# Patient Record
Sex: Female | Born: 1955 | Race: White | Hispanic: No | Marital: Married | Smoking: Never smoker
Health system: Southern US, Community
[De-identification: ages and names within clinical notes are randomized; demographics above are authoritative.]

## PROBLEM LIST (undated history)

## (undated) DIAGNOSIS — E0500 Thyrotoxicosis with diffuse goiter without thyrotoxic crisis or storm: Secondary | ICD-10-CM

## (undated) DIAGNOSIS — I10 Essential (primary) hypertension: Secondary | ICD-10-CM

## (undated) DIAGNOSIS — E785 Hyperlipidemia, unspecified: Secondary | ICD-10-CM

## (undated) HISTORY — DX: Essential (primary) hypertension: I10

## (undated) HISTORY — DX: Hyperlipidemia, unspecified: E78.5

## (undated) HISTORY — DX: Thyrotoxicosis with diffuse goiter without thyrotoxic crisis or storm: E05.00

---

## 2000-11-13 ENCOUNTER — Other Ambulatory Visit: Admission: RE | Admit: 2000-11-13 | Discharge: 2000-11-13 | Payer: Self-pay | Admitting: Obstetrics and Gynecology

## 2002-06-03 ENCOUNTER — Other Ambulatory Visit: Admission: RE | Admit: 2002-06-03 | Discharge: 2002-06-03 | Payer: Self-pay | Admitting: Obstetrics and Gynecology

## 2004-12-13 ENCOUNTER — Other Ambulatory Visit: Admission: RE | Admit: 2004-12-13 | Discharge: 2004-12-13 | Payer: Self-pay | Admitting: Obstetrics and Gynecology

## 2007-09-18 ENCOUNTER — Encounter: Payer: Self-pay | Admitting: Endocrinology

## 2007-10-27 ENCOUNTER — Ambulatory Visit: Payer: Self-pay | Admitting: Endocrinology

## 2007-10-27 DIAGNOSIS — E0500 Thyrotoxicosis with diffuse goiter without thyrotoxic crisis or storm: Secondary | ICD-10-CM

## 2007-10-27 HISTORY — DX: Thyrotoxicosis with diffuse goiter without thyrotoxic crisis or storm: E05.00

## 2007-11-12 ENCOUNTER — Telehealth: Payer: Self-pay | Admitting: Endocrinology

## 2007-11-21 ENCOUNTER — Encounter: Admission: RE | Admit: 2007-11-21 | Discharge: 2007-11-21 | Payer: Self-pay | Admitting: Endocrinology

## 2007-12-22 ENCOUNTER — Ambulatory Visit: Payer: Self-pay | Admitting: Endocrinology

## 2007-12-22 LAB — CONVERTED CEMR LAB: TSH: 0.04 microintl units/mL — ABNORMAL LOW (ref 0.35–5.50)

## 2008-01-21 ENCOUNTER — Ambulatory Visit: Payer: Self-pay | Admitting: Endocrinology

## 2008-01-21 DIAGNOSIS — E890 Postprocedural hypothyroidism: Secondary | ICD-10-CM | POA: Insufficient documentation

## 2008-01-21 LAB — CONVERTED CEMR LAB: Free T4: 1 ng/dL (ref 0.6–1.6)

## 2008-01-30 ENCOUNTER — Telehealth: Payer: Self-pay | Admitting: Endocrinology

## 2008-02-06 ENCOUNTER — Ambulatory Visit: Payer: Self-pay | Admitting: Endocrinology

## 2008-03-10 ENCOUNTER — Ambulatory Visit: Payer: Self-pay | Admitting: Endocrinology

## 2008-03-10 LAB — CONVERTED CEMR LAB
Free T4: 0.5 ng/dL — ABNORMAL LOW (ref 0.6–1.6)
TSH: 21.96 microintl units/mL — ABNORMAL HIGH (ref 0.35–5.50)

## 2008-04-22 ENCOUNTER — Ambulatory Visit: Payer: Self-pay | Admitting: Endocrinology

## 2008-04-22 LAB — CONVERTED CEMR LAB: TSH: 5.19 microintl units/mL (ref 0.35–5.50)

## 2008-05-24 ENCOUNTER — Telehealth (INDEPENDENT_AMBULATORY_CARE_PROVIDER_SITE_OTHER): Payer: Self-pay | Admitting: *Deleted

## 2008-05-25 ENCOUNTER — Ambulatory Visit: Payer: Self-pay | Admitting: Endocrinology

## 2008-05-25 DIAGNOSIS — R040 Epistaxis: Secondary | ICD-10-CM | POA: Insufficient documentation

## 2008-05-25 LAB — CONVERTED CEMR LAB
Basophils Absolute: 0 10*3/uL (ref 0.0–0.1)
Hemoglobin: 16.4 g/dL — ABNORMAL HIGH (ref 12.0–15.0)
Lymphocytes Relative: 22.5 % (ref 12.0–46.0)
MCHC: 35.5 g/dL (ref 30.0–36.0)
Monocytes Relative: 5.6 % (ref 3.0–12.0)
Neutro Abs: 3.8 10*3/uL (ref 1.4–7.7)
Neutrophils Relative %: 67.7 % (ref 43.0–77.0)
RDW: 12.2 % (ref 11.5–14.6)
TSH: 2.9 microintl units/mL (ref 0.35–5.50)

## 2008-08-25 ENCOUNTER — Ambulatory Visit: Payer: Self-pay | Admitting: Endocrinology

## 2008-08-25 LAB — CONVERTED CEMR LAB: TSH: 0.53 microintl units/mL (ref 0.35–5.50)

## 2015-11-15 ENCOUNTER — Other Ambulatory Visit: Payer: Self-pay | Admitting: Obstetrics and Gynecology

## 2015-11-15 DIAGNOSIS — R928 Other abnormal and inconclusive findings on diagnostic imaging of breast: Secondary | ICD-10-CM

## 2015-11-28 ENCOUNTER — Ambulatory Visit
Admission: RE | Admit: 2015-11-28 | Discharge: 2015-11-28 | Disposition: A | Discharge status: Home / Self Care | Payer: BLUE CROSS/BLUE SHIELD | Source: Ambulatory Visit | Attending: Obstetrics and Gynecology | Admitting: Obstetrics and Gynecology

## 2015-11-28 DIAGNOSIS — R928 Other abnormal and inconclusive findings on diagnostic imaging of breast: Secondary | ICD-10-CM

## 2016-05-16 ENCOUNTER — Other Ambulatory Visit: Payer: Self-pay | Admitting: Obstetrics and Gynecology

## 2016-05-16 DIAGNOSIS — N63 Unspecified lump in unspecified breast: Secondary | ICD-10-CM

## 2016-06-22 ENCOUNTER — Ambulatory Visit
Admission: RE | Admit: 2016-06-22 | Discharge: 2016-06-22 | Disposition: A | Discharge status: Home / Self Care | Payer: BLUE CROSS/BLUE SHIELD | Source: Ambulatory Visit | Attending: Obstetrics and Gynecology | Admitting: Obstetrics and Gynecology

## 2016-06-22 DIAGNOSIS — N63 Unspecified lump in unspecified breast: Secondary | ICD-10-CM

## 2016-11-27 ENCOUNTER — Other Ambulatory Visit: Payer: Self-pay | Admitting: Obstetrics and Gynecology

## 2016-11-27 DIAGNOSIS — N6320 Unspecified lump in the left breast, unspecified quadrant: Secondary | ICD-10-CM

## 2016-12-05 ENCOUNTER — Ambulatory Visit
Admission: RE | Admit: 2016-12-05 | Discharge: 2016-12-05 | Disposition: A | Discharge status: Home / Self Care | Payer: BLUE CROSS/BLUE SHIELD | Source: Ambulatory Visit | Attending: Obstetrics and Gynecology | Admitting: Obstetrics and Gynecology

## 2016-12-05 DIAGNOSIS — N6320 Unspecified lump in the left breast, unspecified quadrant: Secondary | ICD-10-CM

## 2017-12-09 ENCOUNTER — Other Ambulatory Visit: Payer: Self-pay | Admitting: Obstetrics and Gynecology

## 2017-12-09 DIAGNOSIS — Z1231 Encounter for screening mammogram for malignant neoplasm of breast: Secondary | ICD-10-CM

## 2017-12-13 ENCOUNTER — Ambulatory Visit
Admission: RE | Admit: 2017-12-13 | Discharge: 2017-12-13 | Disposition: A | Discharge status: Home / Self Care | Payer: BLUE CROSS/BLUE SHIELD | Source: Ambulatory Visit | Attending: Obstetrics and Gynecology | Admitting: Obstetrics and Gynecology

## 2017-12-13 ENCOUNTER — Encounter: Payer: Self-pay | Admitting: Radiology

## 2017-12-13 DIAGNOSIS — Z1231 Encounter for screening mammogram for malignant neoplasm of breast: Secondary | ICD-10-CM

## 2018-10-27 LAB — COLOGUARD: Cologuard: NEGATIVE

## 2019-06-01 DIAGNOSIS — R7989 Other specified abnormal findings of blood chemistry: Secondary | ICD-10-CM | POA: Diagnosis not present

## 2019-06-19 DIAGNOSIS — R7989 Other specified abnormal findings of blood chemistry: Secondary | ICD-10-CM | POA: Diagnosis not present

## 2019-12-08 ENCOUNTER — Telehealth: Payer: Self-pay

## 2019-12-08 NOTE — Telephone Encounter (Signed)
Bridget Jones called to let Dr. Sedalia Muta know that she has decided that she will try the statin medication.  She does not have medication coverage but she is willing to try a statin.  She request that the prescription be sent to  [facility].

## 2019-12-09 ENCOUNTER — Other Ambulatory Visit: Payer: Self-pay | Admitting: Family Medicine

## 2019-12-28 ENCOUNTER — Other Ambulatory Visit: Payer: Self-pay

## 2019-12-28 MED ORDER — LEVOTHYROXINE SODIUM 125 MCG PO TABS: 125.0000 ug | ORAL_TABLET | Freq: Every day | ORAL | 2 refills | Status: DC

## 2019-12-29 ENCOUNTER — Other Ambulatory Visit: Payer: Self-pay

## 2019-12-29 ENCOUNTER — Other Ambulatory Visit: Payer: Self-pay | Admitting: Family Medicine

## 2019-12-29 MED ORDER — ATORVASTATIN CALCIUM 10 MG PO TABS: 10.0000 mg | ORAL_TABLET | Freq: Every day | ORAL | 0 refills | Status: DC

## 2019-12-29 NOTE — Progress Notes (Signed)
Sent atorvastatin. Kc

## 2020-01-25 ENCOUNTER — Other Ambulatory Visit: Payer: Self-pay

## 2020-01-25 MED ORDER — LEVOTHYROXINE SODIUM 125 MCG PO TABS: 125.0000 ug | ORAL_TABLET | Freq: Every day | ORAL | 0 refills | Status: DC

## 2020-03-23 ENCOUNTER — Other Ambulatory Visit: Payer: Self-pay | Admitting: Family Medicine

## 2020-04-06 ENCOUNTER — Ambulatory Visit: Payer: PRIVATE HEALTH INSURANCE | Admitting: Family Medicine

## 2020-04-19 ENCOUNTER — Other Ambulatory Visit: Payer: Self-pay | Admitting: Family Medicine

## 2020-06-08 NOTE — Progress Notes (Signed)
Subjective:  Patient ID: Bridget Jones, female    DOB: 08/09/1956  Age: 64 y.o. MRN: [id number]  Chief Complaint  Patient presents with  . Hypothyroidism  . Hypertension    HPI  Patient presents with atrophy of thyroid (acquired).  Date of diagnosis 2010.  She is currently taking Synthroid, 125 mcg daily.  TSH was last checked 10 months ago.  The result was reported as normal.  She denies any related symptoms.      Pt presents for follow up of hypertension.  Date of diagnosis 2010.  Current nonpharmacologic treatment includes low sodium diet and exercise.    Patient presents for hyperlipidemia. Tolerating lipitor. Eating healthy. Exercising.   Current Outpatient Medications on File Prior to Visit  Medication Sig Dispense Refill  . atorvastatin (LIPITOR) 10 MG tablet Take 1 tablet by mouth once daily 90 tablet 0  . EUTHYROX 125 MCG tablet Take 1 tablet by mouth once daily 90 tablet 0   No current facility-administered medications on file prior to visit.   No past medical history on file. History reviewed. No pertinent surgical history.  Family History  Problem Relation Age of Onset  . Hypertension Mother   . Diabetes Father   . Stroke Father   . Hyperthyroidism Sister    Social History   Socioeconomic History  . Marital status: Married    Spouse name: Not on file  . Number of children: Not on file  . Years of education: Not on file  . Highest education level: Not on file  Occupational History  . Not on file  Tobacco Use  . Smoking status: Never Smoker  . Smokeless tobacco: Never Used  Substance and Sexual Activity  . Alcohol use: Yes    Comment: rarely ( one drink per month)  . Drug use: Never  . Sexual activity: Yes  Other Topics Concern  . Not on file  Social History Narrative  . Not on file   Social Determinants of Health   Financial Resource Strain:   . Difficulty of Paying Living Expenses: Not on file  Food Insecurity:   . Worried About Brewing technologist in the Last Year: Not on file  . Ran Out of Food in the Last Year: Not on file  Transportation Needs:   . Lack of Transportation (Medical): Not on file  . Lack of Transportation (Non-Medical): Not on file  Physical Activity:   . Days of Exercise per Week: Not on file  . Minutes of Exercise per Session: Not on file  Stress:   . Feeling of Stress : Not on file  Social Connections:   . Frequency of Communication with Friends and Family: Not on file  . Frequency of Social Gatherings with Friends and Family: Not on file  . Attends Religious Services: Not on file  . Active Member of Clubs or Organizations: Not on file  . Attends Banker Meetings: Not on file  . Marital Status: Not on file    Review of Systems  Constitutional: Negative for chills, fatigue and fever.  HENT: Negative for congestion, ear pain, rhinorrhea and sore throat.   Respiratory: Negative for cough and shortness of breath.   Cardiovascular: Positive for leg swelling. Negative for chest pain and palpitations.  Gastrointestinal: Negative for abdominal pain, constipation, diarrhea, nausea and vomiting.  Genitourinary: Negative for dysuria and urgency.  Musculoskeletal: Positive for arthralgias (right knee pain ). Negative for back pain and myalgias.  Neurological: Negative for  dizziness, weakness, light-headedness and headaches.  Psychiatric/Behavioral: Negative for dysphoric mood. The patient is not nervous/anxious.      Objective:  BP 114/68   Pulse 72   Temp 97.6 F (36.4 C)   Resp 16   Ht 5\' 5"  (1.651 m)   Wt 210 lb 9.6 oz (95.5 kg)   BMI 35.05 kg/m   BP/Weight 06/09/2020 08/25/2008 05/25/2008  Systolic BP 114 124 112  Diastolic BP 68 80 82  Wt. (Lbs) 210.6 193 183  BMI 35.05 - -    Physical Exam Vitals reviewed.  Constitutional:      Appearance: Normal appearance.  Cardiovascular:     Rate and Rhythm: Normal rate and regular rhythm.  Pulmonary:     Effort: Pulmonary effort is  normal.     Breath sounds: Normal breath sounds.  Abdominal:     General: Bowel sounds are normal.  Musculoskeletal:        General: Normal range of motion.     Cervical back: Normal range of motion.  Skin:    General: Skin is warm.  Neurological:     Mental Status: She is alert.  Psychiatric:        Mood and Affect: Mood normal.        Behavior: Behavior normal.     Diabetic Foot Exam - Simple   No data filed       Lab Results  Component Value Date   WBC 5.1 06/09/2020   HGB 15.4 06/09/2020   HCT 47.9 (H) 06/09/2020   PLT 222 06/09/2020   GLUCOSE 91 06/09/2020   CHOL 146 06/09/2020   TRIG 79 06/09/2020   HDL 40 06/09/2020   LDLCALC 91 06/09/2020   ALT 35 (H) 06/09/2020   AST 28 06/09/2020   NA 142 06/09/2020   K 4.5 06/09/2020   CL 106 06/09/2020   CREATININE 0.83 06/09/2020   BUN 16 06/09/2020   CO2 22 06/09/2020   TSH 1.420 06/09/2020      Assessment & Plan:   1. Essential hypertension Well controlled.  No changes to medicines.  Continue to work on eating a healthy diet and exercise.  Labs drawn today.  - CBC with Differential/Platelet - Comprehensive metabolic panel - Lipid panel  2. Hypothyroidism, unspecified type Well controlled.  No changes to medicines.  Continue to work on eating a healthy diet and exercise.  Labs drawn today.  - TSH   3. Elevated hemoglobin (HCC) Check ferritin - Ferritin  4. Class 2 severe obesity due to excess calories with serious comorbidity and body mass index (BMI) of 35.0 to 35.9 in adult [facility]) Recommend continue to work on eating healthy diet and exercise.   Orders Placed This Encounter  Procedures  . CBC with Differential/Platelet  . Comprehensive metabolic panel  . Lipid panel  . TSH  . Ferritin  . Cardiovascular Risk Assessment     Follow-up: No follow-ups on file.  An After Visit Summary was printed and given to the patient.  [facility] [phone]

## 2020-06-09 ENCOUNTER — Encounter: Payer: Self-pay | Admitting: Family Medicine

## 2020-06-09 ENCOUNTER — Other Ambulatory Visit: Payer: Self-pay

## 2020-06-09 ENCOUNTER — Ambulatory Visit (INDEPENDENT_AMBULATORY_CARE_PROVIDER_SITE_OTHER): Payer: PRIVATE HEALTH INSURANCE | Admitting: Family Medicine

## 2020-06-09 VITALS — BP 114/68 | HR 72 | Temp 97.6°F | Resp 16 | Ht 65.0 in | Wt 210.6 lb

## 2020-06-09 DIAGNOSIS — D582 Other hemoglobinopathies: Secondary | ICD-10-CM

## 2020-06-09 DIAGNOSIS — E890 Postprocedural hypothyroidism: Secondary | ICD-10-CM | POA: Diagnosis not present

## 2020-06-09 DIAGNOSIS — I10 Essential (primary) hypertension: Secondary | ICD-10-CM | POA: Diagnosis not present

## 2020-06-09 DIAGNOSIS — Z6835 Body mass index (BMI) 35.0-35.9, adult: Secondary | ICD-10-CM

## 2020-06-09 NOTE — Patient Instructions (Signed)
Compression stockings.  

## 2020-06-10 LAB — CBC WITH DIFFERENTIAL/PLATELET
Basophils Absolute: 0.1 10*3/uL (ref 0.0–0.2)
Basos: 1 %
EOS (ABSOLUTE): 0.2 10*3/uL (ref 0.0–0.4)
Eos: 4 %
Hematocrit: 47.9 % — ABNORMAL HIGH (ref 34.0–46.6)
Hemoglobin: 15.4 g/dL (ref 11.1–15.9)
Immature Grans (Abs): 0 10*3/uL (ref 0.0–0.1)
Immature Granulocytes: 0 %
Lymphocytes Absolute: 2.1 10*3/uL (ref 0.7–3.1)
Lymphs: 40 %
MCH: 29 pg (ref 26.6–33.0)
MCHC: 32.2 g/dL (ref 31.5–35.7)
MCV: 90 fL (ref 79–97)
Monocytes Absolute: 0.5 10*3/uL (ref 0.1–0.9)
Monocytes: 10 %
Neutrophils Absolute: 2.3 10*3/uL (ref 1.4–7.0)
Neutrophils: 45 %
Platelets: 222 10*3/uL (ref 150–450)
RBC: 5.31 x10E6/uL — ABNORMAL HIGH (ref 3.77–5.28)
RDW: 13.6 % (ref 11.7–15.4)
WBC: 5.1 10*3/uL (ref 3.4–10.8)

## 2020-06-10 LAB — COMPREHENSIVE METABOLIC PANEL
ALT: 35 IU/L — ABNORMAL HIGH (ref 0–32)
AST: 28 IU/L (ref 0–40)
Albumin/Globulin Ratio: 1.6 (ref 1.2–2.2)
Albumin: 4.2 g/dL (ref 3.8–4.8)
Alkaline Phosphatase: 62 IU/L (ref 48–121)
BUN/Creatinine Ratio: 19 (ref 12–28)
BUN: 16 mg/dL (ref 8–27)
Bilirubin Total: 1 mg/dL (ref 0.0–1.2)
CO2: 22 mmol/L (ref 20–29)
Calcium: 9.5 mg/dL (ref 8.7–10.3)
Chloride: 106 mmol/L (ref 96–106)
Creatinine, Ser: 0.83 mg/dL (ref 0.57–1.00)
GFR calc Af Amer: 87 mL/min/{1.73_m2} (ref >59)
GFR calc non Af Amer: 75 mL/min/{1.73_m2} (ref >59)
Globulin, Total: 2.6 g/dL (ref 1.5–4.5)
Glucose: 91 mg/dL (ref 65–99)
Potassium: 4.5 mmol/L (ref 3.5–5.2)
Sodium: 142 mmol/L (ref 134–144)
Total Protein: 6.8 g/dL (ref 6.0–8.5)

## 2020-06-10 LAB — LIPID PANEL
Chol/HDL Ratio: 3.7 ratio (ref 0.0–4.4)
Cholesterol, Total: 146 mg/dL (ref 100–199)
HDL: 40 mg/dL (ref >39)
LDL Chol Calc (NIH): 91 mg/dL (ref 0–99)
Triglycerides: 79 mg/dL (ref 0–149)
VLDL Cholesterol Cal: 15 mg/dL (ref 5–40)

## 2020-06-10 LAB — FERRITIN: Ferritin: 401 ng/mL — ABNORMAL HIGH (ref 15–150)

## 2020-06-10 LAB — TSH: TSH: 1.42 u[IU]/mL (ref 0.450–4.500)

## 2020-06-10 LAB — CARDIOVASCULAR RISK ASSESSMENT

## 2020-06-21 ENCOUNTER — Encounter: Payer: Self-pay | Admitting: Family Medicine

## 2020-06-27 ENCOUNTER — Other Ambulatory Visit: Payer: Self-pay | Admitting: Family Medicine

## 2020-08-26 ENCOUNTER — Other Ambulatory Visit: Payer: Self-pay | Admitting: Obstetrics and Gynecology

## 2020-08-26 DIAGNOSIS — Z1231 Encounter for screening mammogram for malignant neoplasm of breast: Secondary | ICD-10-CM

## 2020-09-26 ENCOUNTER — Other Ambulatory Visit: Payer: Self-pay

## 2020-09-26 MED ORDER — ATORVASTATIN CALCIUM 10 MG PO TABS: 10.0000 mg | ORAL_TABLET | Freq: Every day | ORAL | 0 refills | Status: DC

## 2020-10-06 ENCOUNTER — Other Ambulatory Visit: Payer: Self-pay | Admitting: Obstetrics and Gynecology

## 2020-10-06 ENCOUNTER — Other Ambulatory Visit: Payer: Self-pay

## 2020-10-06 ENCOUNTER — Ambulatory Visit
Admission: RE | Admit: 2020-10-06 | Discharge: 2020-10-06 | Disposition: A | Discharge status: Home / Self Care | Payer: PRIVATE HEALTH INSURANCE | Source: Ambulatory Visit | Attending: Obstetrics and Gynecology | Admitting: Obstetrics and Gynecology

## 2020-10-06 DIAGNOSIS — Z1231 Encounter for screening mammogram for malignant neoplasm of breast: Secondary | ICD-10-CM

## 2020-11-10 NOTE — Progress Notes (Signed)
Subjective:  Patient ID: Bridget Jones, female    DOB: May 22, 1956  Age: 65 y.o. MRN: [id number]  Chief Complaint  Patient presents with  . Hypothyroidism  . Hypertension    HPI Hyperlipidemia: On lipitor 10 mg once daily. Eating healthy and exercise. Hypothyroidism - on Syntrhoid 125 mcg once daily  Current Outpatient Medications on File Prior to Visit  Medication Sig Dispense Refill  . atorvastatin (LIPITOR) 10 MG tablet Take 1 tablet (10 mg total) by mouth daily. 90 tablet 0  . calcium carbonate (OSCAL) 1500 (600 Ca) MG TABS tablet Take by mouth 2 (two) times daily with a meal.    . levothyroxine (SYNTHROID) 125 MCG tablet Take 1 tablet by mouth once daily 90 tablet 0  . Multiple Vitamin (MULTIVITAMIN) tablet Take 1 tablet by mouth daily.    . Omega-3 Fatty Acids (FISH OIL) 1000 MG CPDR Take by mouth.    . Turmeric (QC TUMERIC COMPLEX PO) Take by mouth.     No current facility-administered medications on file prior to visit.   Past Medical History:  Diagnosis Date  . Hyperlipidemia   . Hypertension    History reviewed. No pertinent surgical history.  Family History  Problem Relation Age of Onset  . Hypertension Mother   . Diabetes Father   . Stroke Father   . Hyperthyroidism Sister    Social History   Socioeconomic History  . Marital status: Married    Spouse name: Not on file  . Number of children: 2  . Years of education: Not on file  . Highest education level: Not on file  Occupational History  . Not on file  Tobacco Use  . Smoking status: Never Smoker  . Smokeless tobacco: Never Used  Substance and Sexual Activity  . Alcohol use: Yes    Comment: rarely ( one drink per month)  . Drug use: Never  . Sexual activity: Yes  Other Topics Concern  . Not on file  Social History Narrative  . Not on file   Social Determinants of Health   Financial Resource Strain: Not on file  Food Insecurity: Not on file  Transportation Needs: Not on file  Physical  Activity: Not on file  Stress: Not on file  Social Connections: Not on file    Review of Systems  Constitutional: Negative for chills, fatigue and fever.  HENT: Negative for congestion, ear pain, rhinorrhea and sore throat.   Respiratory: Negative for cough and shortness of breath.   Cardiovascular: Negative for chest pain.  Gastrointestinal: Negative for abdominal pain, constipation, diarrhea, nausea and vomiting.  Genitourinary: Negative for dysuria and urgency.  Musculoskeletal: Positive for arthralgias (Knees. Takes tumeric and occasionally tylenoll). Negative for back pain and myalgias.  Neurological: Negative for dizziness, weakness, light-headedness and headaches.  Psychiatric/Behavioral: Negative for dysphoric mood. The patient is not nervous/anxious.     Objective:  BP 130/80   Pulse 68   Temp 97.7 F (36.5 C)   Resp 16   Ht 5\' 5"  (1.651 m)   Wt 215 lb 3.2 oz (97.6 kg)   BMI 35.81 kg/m   BP/Weight 11/11/2020 06/09/2020 08/25/2008  Systolic BP 130 114 124  Diastolic BP 80 68 80  Wt. (Lbs) 215.2 210.6 193  BMI 35.81 35.05 -    Physical Exam Vitals reviewed.  Constitutional:      Appearance: Normal appearance.  Cardiovascular:     Rate and Rhythm: Normal rate and regular rhythm.  Pulmonary:     Effort:  Pulmonary effort is normal.     Breath sounds: Normal breath sounds.  Abdominal:     General: Bowel sounds are normal.  Musculoskeletal:        General: Normal range of motion.     Cervical back: Normal range of motion.  Skin:    General: Skin is warm.  Neurological:     Mental Status: She is alert.  Psychiatric:        Mood and Affect: Mood normal.        Behavior: Behavior normal.     Diabetic Foot Exam - Simple   No data filed      Lab Results  Component Value Date   WBC 5.1 06/09/2020   HGB 15.4 06/09/2020   HCT 47.9 (H) 06/09/2020   PLT 222 06/09/2020   GLUCOSE 91 06/09/2020   CHOL 146 06/09/2020   TRIG 79 06/09/2020   HDL 40 06/09/2020    LDLCALC 91 06/09/2020   ALT 35 (H) 06/09/2020   AST 28 06/09/2020   NA 142 06/09/2020   K 4.5 06/09/2020   CL 106 06/09/2020   CREATININE 0.83 06/09/2020   BUN 16 06/09/2020   CO2 22 06/09/2020   TSH 1.420 06/09/2020      Assessment & Plan:   1. Postablative hypothyroidism - TSH  2. Mixed hyperlipidemia Recommend continue to work on eating healthy diet and exercise. The current medical regimen is effective;  continue present plan and medications. - CBC with Differential/Platelet - Comprehensive metabolic panel - Lipid panel  3. Class 2 drug-induced obesity with serious comorbidity and body mass index (BMI) of 35.0 to 35.9 in adult  Recommend continue to work on eating healthy diet and exercise.  Recommend covid 19 vaccination booster. Pt to think about it.   Orders Placed This Encounter  Procedures  . CBC with Differential/Platelet  . Comprehensive metabolic panel  . Lipid panel  . TSH     Follow-up: Return in about 6 months (around 05/11/2021) for fasting.  An After Visit Summary was printed and given to the patient.  Blane Ohara, MD [facility] [phone]

## 2020-11-11 ENCOUNTER — Ambulatory Visit (INDEPENDENT_AMBULATORY_CARE_PROVIDER_SITE_OTHER): Payer: PRIVATE HEALTH INSURANCE | Admitting: Family Medicine

## 2020-11-11 ENCOUNTER — Encounter: Payer: Self-pay | Admitting: Family Medicine

## 2020-11-11 ENCOUNTER — Other Ambulatory Visit: Payer: Self-pay

## 2020-11-11 VITALS — BP 130/80 | HR 68 | Temp 97.7°F | Resp 16 | Ht 65.0 in | Wt 215.2 lb

## 2020-11-11 DIAGNOSIS — Z6835 Body mass index (BMI) 35.0-35.9, adult: Secondary | ICD-10-CM | POA: Diagnosis not present

## 2020-11-11 DIAGNOSIS — E890 Postprocedural hypothyroidism: Secondary | ICD-10-CM

## 2020-11-11 DIAGNOSIS — E661 Drug-induced obesity: Secondary | ICD-10-CM | POA: Diagnosis not present

## 2020-11-11 DIAGNOSIS — I10 Essential (primary) hypertension: Secondary | ICD-10-CM

## 2020-11-11 DIAGNOSIS — E782 Mixed hyperlipidemia: Secondary | ICD-10-CM

## 2020-11-11 NOTE — Patient Instructions (Signed)
Call gynecology for physical.  Return next week for Shingrix vaccination.  Consider covid 19 booster.  Recommend continue to work on eating healthy diet and exercise.

## 2020-11-12 LAB — CBC WITH DIFFERENTIAL/PLATELET
Basophils Absolute: 0 10*3/uL (ref 0.0–0.2)
Basos: 1 %
EOS (ABSOLUTE): 0.1 10*3/uL (ref 0.0–0.4)
Eos: 3 %
Hematocrit: 49.5 % — ABNORMAL HIGH (ref 34.0–46.6)
Hemoglobin: 16.7 g/dL — ABNORMAL HIGH (ref 11.1–15.9)
Immature Grans (Abs): 0 10*3/uL (ref 0.0–0.1)
Immature Granulocytes: 0 %
Lymphocytes Absolute: 1.7 10*3/uL (ref 0.7–3.1)
Lymphs: 34 %
MCH: 30.7 pg (ref 26.6–33.0)
MCHC: 33.7 g/dL (ref 31.5–35.7)
MCV: 91 fL (ref 79–97)
Monocytes Absolute: 0.4 10*3/uL (ref 0.1–0.9)
Monocytes: 8 %
Neutrophils Absolute: 2.8 10*3/uL (ref 1.4–7.0)
Neutrophils: 54 %
Platelets: 255 10*3/uL (ref 150–450)
RBC: 5.44 x10E6/uL — ABNORMAL HIGH (ref 3.77–5.28)
RDW: 13 % (ref 11.7–15.4)
WBC: 5.1 10*3/uL (ref 3.4–10.8)

## 2020-11-12 LAB — COMPREHENSIVE METABOLIC PANEL
ALT: 22 IU/L (ref 0–32)
AST: 22 IU/L (ref 0–40)
Albumin/Globulin Ratio: 1.7 (ref 1.2–2.2)
Albumin: 4.5 g/dL (ref 3.8–4.8)
Alkaline Phosphatase: 68 IU/L (ref 44–121)
BUN/Creatinine Ratio: 23 (ref 12–28)
BUN: 20 mg/dL (ref 8–27)
Bilirubin Total: 0.6 mg/dL (ref 0.0–1.2)
CO2: 23 mmol/L (ref 20–29)
Calcium: 9.3 mg/dL (ref 8.7–10.3)
Chloride: 103 mmol/L (ref 96–106)
Creatinine, Ser: 0.86 mg/dL (ref 0.57–1.00)
GFR calc Af Amer: 83 mL/min/{1.73_m2} (ref >59)
GFR calc non Af Amer: 72 mL/min/{1.73_m2} (ref >59)
Globulin, Total: 2.7 g/dL (ref 1.5–4.5)
Glucose: 100 mg/dL — ABNORMAL HIGH (ref 65–99)
Potassium: 4.7 mmol/L (ref 3.5–5.2)
Sodium: 141 mmol/L (ref 134–144)
Total Protein: 7.2 g/dL (ref 6.0–8.5)

## 2020-11-12 LAB — LIPID PANEL
Chol/HDL Ratio: 3.5 ratio (ref 0.0–4.4)
Cholesterol, Total: 131 mg/dL (ref 100–199)
HDL: 37 mg/dL — ABNORMAL LOW (ref >39)
LDL Chol Calc (NIH): 78 mg/dL (ref 0–99)
Triglycerides: 80 mg/dL (ref 0–149)
VLDL Cholesterol Cal: 16 mg/dL (ref 5–40)

## 2020-11-12 LAB — TSH: TSH: 1.35 u[IU]/mL (ref 0.450–4.500)

## 2020-11-12 LAB — CARDIOVASCULAR RISK ASSESSMENT

## 2020-11-16 ENCOUNTER — Other Ambulatory Visit: Payer: Self-pay

## 2020-11-16 ENCOUNTER — Ambulatory Visit (INDEPENDENT_AMBULATORY_CARE_PROVIDER_SITE_OTHER): Payer: PRIVATE HEALTH INSURANCE

## 2020-11-16 DIAGNOSIS — Z23 Encounter for immunization: Secondary | ICD-10-CM

## 2020-12-04 ENCOUNTER — Other Ambulatory Visit: Payer: Self-pay | Admitting: Family Medicine

## 2020-12-05 MED ORDER — LEVOTHYROXINE SODIUM 125 MCG PO TABS: 125.0000 ug | ORAL_TABLET | Freq: Every day | ORAL | 0 refills | Status: DC

## 2020-12-05 MED ORDER — ATORVASTATIN CALCIUM 10 MG PO TABS: 10.0000 mg | ORAL_TABLET | Freq: Every day | ORAL | 0 refills | Status: DC

## 2021-01-16 ENCOUNTER — Ambulatory Visit (INDEPENDENT_AMBULATORY_CARE_PROVIDER_SITE_OTHER): Payer: PRIVATE HEALTH INSURANCE

## 2021-01-16 DIAGNOSIS — Z23 Encounter for immunization: Secondary | ICD-10-CM

## 2021-01-16 NOTE — Progress Notes (Signed)
Pt in office for second shingrix vaccine. Pt tolerated vaccine in L deltoid.   Lorita Officer, [place] 01/16/21 8:18 AM

## 2021-03-22 ENCOUNTER — Other Ambulatory Visit: Payer: Self-pay | Admitting: Physician Assistant

## 2021-03-23 MED ORDER — ATORVASTATIN CALCIUM 10 MG PO TABS: 10.0000 mg | ORAL_TABLET | Freq: Every day | ORAL | 1 refills | Status: DC

## 2021-03-23 MED ORDER — LEVOTHYROXINE SODIUM 125 MCG PO TABS: 125.0000 ug | ORAL_TABLET | Freq: Every day | ORAL | 1 refills | Status: DC

## 2021-07-10 ENCOUNTER — Other Ambulatory Visit: Payer: Self-pay

## 2021-07-10 ENCOUNTER — Ambulatory Visit (INDEPENDENT_AMBULATORY_CARE_PROVIDER_SITE_OTHER): Payer: PRIVATE HEALTH INSURANCE | Admitting: Family Medicine

## 2021-07-10 ENCOUNTER — Encounter: Payer: Self-pay | Admitting: Family Medicine

## 2021-07-10 VITALS — BP 118/76 | HR 88 | Temp 97.4°F | Resp 16 | Ht 65.0 in | Wt 218.0 lb

## 2021-07-10 DIAGNOSIS — E66812 Obesity, class 2: Secondary | ICD-10-CM | POA: Insufficient documentation

## 2021-07-10 DIAGNOSIS — Z6836 Body mass index (BMI) 36.0-36.9, adult: Secondary | ICD-10-CM

## 2021-07-10 DIAGNOSIS — Z1231 Encounter for screening mammogram for malignant neoplasm of breast: Secondary | ICD-10-CM

## 2021-07-10 DIAGNOSIS — E782 Mixed hyperlipidemia: Secondary | ICD-10-CM

## 2021-07-10 DIAGNOSIS — I10 Essential (primary) hypertension: Secondary | ICD-10-CM | POA: Insufficient documentation

## 2021-07-10 DIAGNOSIS — E890 Postprocedural hypothyroidism: Secondary | ICD-10-CM | POA: Diagnosis not present

## 2021-07-10 NOTE — Patient Instructions (Signed)
[email or web address] [facility].  Healthy Eating Following a healthy eating pattern may help you to achieve and maintain a healthy body weight, reduce the risk of chronic disease, and live a long and productive life. It is important to follow a healthy eating pattern at an appropriate calorie level for your body. Your nutritional needs should be met primarily through food by choosing a variety of nutrient-rich foods. What are tips for following this plan? Reading food labels Read labels and choose the following: Reduced or low sodium. Juices with 100% fruit juice. Foods with low saturated fats and high polyunsaturated and monounsaturated fats. Foods with whole grains, such as whole wheat, cracked wheat, brown rice, and wild rice. Whole grains that are fortified with folic acid. This is recommended for women who are pregnant or who want to become pregnant. Read labels and avoid the following: Foods with a lot of added sugars. These include foods that contain brown sugar, corn sweetener, corn syrup, dextrose, fructose, glucose, high-fructose corn syrup, honey, invert sugar, lactose, malt syrup, maltose, molasses, raw sugar, sucrose, trehalose, or turbinado sugar. Do not eat more than the following amounts of added sugar per day: 6 teaspoons (25 g) for women. 9 teaspoons (38 g) for men. Foods that contain processed or refined starches and grains. Refined grain products, such as white flour, degermed cornmeal, white bread, and white rice. Shopping Choose nutrient-rich snacks, such as vegetables, whole fruits, and nuts. Avoid high-calorie and high-sugar snacks, such as potato chips, fruit snacks, and candy. Use oil-based dressings and spreads on foods instead of solid fats such as butter, stick margarine, or cream cheese. Limit pre-made sauces, mixes, and "instant" products such as flavored rice, instant noodles, and ready-made pasta. Try more plant-protein sources,  such as tofu, tempeh, black beans, edamame, lentils, nuts, and seeds. Explore eating plans such as the Mediterranean diet or vegetarian diet. Cooking Use oil to saut or stir-fry foods instead of solid fats such as butter, stick margarine, or lard. Try baking, boiling, grilling, or broiling instead of frying. Remove the fatty part of meats before cooking. Steam vegetables in water or broth. Meal planning  At meals, imagine dividing your plate into fourths: One-half of your plate is fruits and vegetables. One-fourth of your plate is whole grains. One-fourth of your plate is protein, especially lean meats, poultry, eggs, tofu, beans, or nuts. Include low-fat dairy as part of your daily diet. Lifestyle Choose healthy options in all settings, including home, work, school, restaurants, or stores. Prepare your food safely: Wash your hands after handling raw meats. Keep food preparation surfaces clean by regularly washing with hot, soapy water. Keep raw meats separate from ready-to-eat foods, such as fruits and vegetables. Cook seafood, meat, poultry, and eggs to the recommended internal temperature. Store foods at safe temperatures. In general: Keep cold foods at 39F (4.4C) or below. Keep hot foods at 139F (60C) or above. Keep your freezer at [facility] (-17.8C) or below. Foods are no longer safe to eat when they have been between the temperatures of 40-139F (4.4-60C) for more than 2 hours. What foods should I eat? Fruits Aim to eat 2 cup-equivalents of fresh, canned (in natural juice), or frozen fruits each day. Examples of 1 cup-equivalent of fruit include 1 small apple, 8 large strawberries, 1 cup canned fruit,  cup dried fruit, or 1 cup 100% juice. Vegetables Aim to eat 2-3 cup-equivalents of fresh and frozen vegetables each day, including different varieties and colors. Examples of 1 cup-equivalent of  vegetables include 2 medium carrots, 2 cups raw, leafy greens, 1 cup chopped  vegetable (raw or cooked), or 1 medium baked potato. Grains Aim to eat 6 ounce-equivalents of whole grains each day. Examples of 1 ounce-equivalent of grains include 1 slice of bread, 1 cup ready-to-eat cereal, 3 cups popcorn, or  cup cooked rice, pasta, or cereal. Meats and other proteins Aim to eat 5-6 ounce-equivalents of protein each day. Examples of 1 ounce-equivalent of protein include 1 egg, 1/2 cup nuts or seeds, or 1 tablespoon (16 g) peanut butter. A cut of meat or fish that is the size of a deck of cards is about 3-4 ounce-equivalents. Of the protein you eat each week, try to have at least 8 ounces come from seafood. This includes salmon, trout, herring, and anchovies. Dairy Aim to eat 3 cup-equivalents of fat-free or low-fat dairy each day. Examples of 1 cup-equivalent of dairy include 1 cup (240 mL) milk, 8 ounces (250 g) yogurt, 1 ounces (44 g) natural cheese, or 1 cup (240 mL) fortified soy milk. Fats and oils Aim for about 5 teaspoons (21 g) per day. Choose monounsaturated fats, such as canola and olive oils, avocados, peanut butter, and most nuts, or polyunsaturated fats, such as sunflower, corn, and soybean oils, walnuts, pine nuts, sesame seeds, sunflower seeds, and flaxseed. Beverages Aim for six 8-oz glasses of water per day. Limit coffee to three to five 8-oz cups per day. Limit caffeinated beverages that have added calories, such as soda and energy drinks. Limit alcohol intake to no more than 1 drink a day for nonpregnant women and 2 drinks a day for men. One drink equals 12 oz of beer (355 mL), 5 oz of wine (148 mL), or 1 oz of hard liquor (44 mL). Seasoning and other foods Avoid adding excess amounts of salt to your foods. Try flavoring foods with herbs and spices instead of salt. Avoid adding sugar to foods. Try using oil-based dressings, sauces, and spreads instead of solid fats. This information is based on general [place] nutrition guidelines. For more information,  visit [employer]. Exact amounts may vary based on your nutrition needs. Summary A healthy eating plan may help you to maintain a healthy weight, reduce the risk of chronic diseases, and stay active throughout your life. Plan your meals. Make sure you eat the right portions of a variety of nutrient-rich foods. Try baking, boiling, grilling, or broiling instead of frying. Choose healthy options in all settings, including home, work, school, restaurants, or stores. This information is not intended to replace advice given to you by your health care provider. Make sure you discuss any questions you have with your health care provider. Document Revised: 01/13/2018 Document Reviewed: 01/13/2018 Elsevier Patient Education  [place].
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
## 2021-07-10 NOTE — Assessment & Plan Note (Signed)
Well controlled. No changes to medicines.  Continue to work on eating a healthy diet and exercise.    

## 2021-07-10 NOTE — Assessment & Plan Note (Signed)
The current medical regimen is effective;  continue present plan and medications.  

## 2021-07-10 NOTE — Assessment & Plan Note (Addendum)
Diet controlled.  Continue exercising. Education on healthy diet given.

## 2021-07-10 NOTE — Assessment & Plan Note (Signed)
Education on healthy diet given. Encouraged to continue exercising.  Recommend consider healthy weight and wellness clinic.

## 2021-07-11 LAB — LIPID PANEL
Chol/HDL Ratio: 3.9 ratio (ref 0.0–4.4)
Cholesterol, Total: 159 mg/dL (ref 100–199)
HDL: 41 mg/dL (ref >39)
LDL Chol Calc (NIH): 96 mg/dL (ref 0–99)
Triglycerides: 121 mg/dL (ref 0–149)
VLDL Cholesterol Cal: 22 mg/dL (ref 5–40)

## 2021-07-11 LAB — COMPREHENSIVE METABOLIC PANEL
ALT: 25 IU/L (ref 0–32)
AST: 20 IU/L (ref 0–40)
Albumin/Globulin Ratio: 1.8 (ref 1.2–2.2)
Albumin: 4.2 g/dL (ref 3.8–4.8)
Alkaline Phosphatase: 62 IU/L (ref 44–121)
BUN/Creatinine Ratio: 18 (ref 12–28)
BUN: 15 mg/dL (ref 8–27)
Bilirubin Total: 0.9 mg/dL (ref 0.0–1.2)
CO2: 22 mmol/L (ref 20–29)
Calcium: 9.2 mg/dL (ref 8.7–10.3)
Chloride: 104 mmol/L (ref 96–106)
Creatinine, Ser: 0.83 mg/dL (ref 0.57–1.00)
Globulin, Total: 2.4 g/dL (ref 1.5–4.5)
Glucose: 99 mg/dL (ref 65–99)
Potassium: 4.3 mmol/L (ref 3.5–5.2)
Sodium: 141 mmol/L (ref 134–144)
Total Protein: 6.6 g/dL (ref 6.0–8.5)
eGFR: 79 mL/min/{1.73_m2} (ref >59)

## 2021-07-11 LAB — CBC WITH DIFFERENTIAL/PLATELET
Basophils Absolute: 0.1 10*3/uL (ref 0.0–0.2)
Basos: 1 %
EOS (ABSOLUTE): 0.2 10*3/uL (ref 0.0–0.4)
Eos: 3 %
Hematocrit: 48.5 % — ABNORMAL HIGH (ref 34.0–46.6)
Hemoglobin: 16.3 g/dL — ABNORMAL HIGH (ref 11.1–15.9)
Immature Grans (Abs): 0 10*3/uL (ref 0.0–0.1)
Immature Granulocytes: 0 %
Lymphocytes Absolute: 1.4 10*3/uL (ref 0.7–3.1)
Lymphs: 29 %
MCH: 30.2 pg (ref 26.6–33.0)
MCHC: 33.6 g/dL (ref 31.5–35.7)
MCV: 90 fL (ref 79–97)
Monocytes Absolute: 0.4 10*3/uL (ref 0.1–0.9)
Monocytes: 9 %
Neutrophils Absolute: 2.8 10*3/uL (ref 1.4–7.0)
Neutrophils: 58 %
Platelets: 226 10*3/uL (ref 150–450)
RBC: 5.39 x10E6/uL — ABNORMAL HIGH (ref 3.77–5.28)
RDW: 13.2 % (ref 11.7–15.4)
WBC: 4.9 10*3/uL (ref 3.4–10.8)

## 2021-07-12 ENCOUNTER — Encounter: Payer: Self-pay | Admitting: Family Medicine

## 2021-07-13 LAB — TSH: TSH: 1.36 u[IU]/mL (ref 0.450–4.500)

## 2021-07-13 LAB — SPECIMEN STATUS REPORT

## 2021-07-21 ENCOUNTER — Telehealth: Payer: Self-pay

## 2021-07-21 NOTE — Telephone Encounter (Signed)
Patient was notified about Screening mammogram appointment on 10/10/2021 at 9:30 am arriving at 9:00 am.

## 2021-09-19 ENCOUNTER — Other Ambulatory Visit: Payer: Self-pay

## 2021-09-19 ENCOUNTER — Ambulatory Visit (INDEPENDENT_AMBULATORY_CARE_PROVIDER_SITE_OTHER): Payer: PRIVATE HEALTH INSURANCE | Admitting: Family Medicine

## 2021-09-19 ENCOUNTER — Encounter: Payer: Self-pay | Admitting: Family Medicine

## 2021-09-19 VITALS — BP 130/76 | HR 73 | Temp 97.2°F | Ht 65.0 in | Wt 224.0 lb

## 2021-09-19 DIAGNOSIS — L659 Nonscarring hair loss, unspecified: Secondary | ICD-10-CM

## 2021-09-19 DIAGNOSIS — E890 Postprocedural hypothyroidism: Secondary | ICD-10-CM | POA: Diagnosis not present

## 2021-09-19 NOTE — Progress Notes (Signed)
Acute Office Visit  Subjective:    Patient ID: Bridget Jones, female    DOB: March 16, 1956, 65 y.o.   MRN: [id number]  Chief Complaint  Patient presents with   Alopecia    HPI: Patient is in today for alopecia and thyroid. Patient states she is easily irritated and very fatigue which is very abnormal for her. Last TSH 07/10/2021 1.360.  Denies any recent surgeries in the past 3 months, no illnesses or stressed. Colors her hair.   Past Medical History:  Diagnosis Date   Hyperlipidemia    Hypertension     History reviewed. No pertinent surgical history.  Family History  Problem Relation Age of Onset   Hypertension Mother    Diabetes Father    Stroke Father    Hyperthyroidism Sister     Social History   Socioeconomic History   Marital status: Married    Spouse name: Not on file   Number of children: 2   Years of education: Not on file   Highest education level: Not on file  Occupational History   Not on file  Tobacco Use   Smoking status: Never   Smokeless tobacco: Never  Substance and Sexual Activity   Alcohol use: Yes    Comment: rarely ( one drink per month)   Drug use: Never   Sexual activity: Yes  Other Topics Concern   Not on file  Social History Narrative   Not on file   Social Determinants of Health   Financial Resource Strain: Not on file  Food Insecurity: Not on file  Transportation Needs: Not on file  Physical Activity: Not on file  Stress: Not on file  Social Connections: Not on file  Intimate Partner Violence: Not on file    Outpatient Medications Prior to Visit  Medication Sig Dispense Refill   atorvastatin (LIPITOR) 10 MG tablet Take 1 tablet (10 mg total) by mouth daily. 90 tablet 1   levothyroxine (SYNTHROID) 125 MCG tablet Take 1 tablet (125 mcg total) by mouth daily. 90 tablet 1   Magnesium Citrate 200 MG TABS      Biotin (BIOTIN 5000) 5 MG CAPS      calcium carbonate (OSCAL) 1500 (600 Ca) MG TABS tablet Take by mouth 2 (two) times  daily with a meal.     Multiple Vitamin (MULTIVITAMIN) tablet Take 1 tablet by mouth daily.     Omega-3 Fatty Acids (FISH OIL) 1000 MG CPDR Take by mouth.     Turmeric (QC TUMERIC COMPLEX PO) Take by mouth.     No facility-administered medications prior to visit.    No Known Allergies  Review of Systems  Constitutional:  Positive for fatigue. Negative for chills and fever.  HENT:  Negative for congestion, ear pain, postnasal drip, rhinorrhea, sinus pressure, sinus pain and sore throat.   Respiratory:  Negative for cough and shortness of breath.   Cardiovascular:  Negative for chest pain.  Gastrointestinal:  Negative for diarrhea and nausea.  Neurological:  Negative for headaches.      Objective:    Physical Exam Vitals reviewed.  Constitutional:      Appearance: Normal appearance.  Cardiovascular:     Rate and Rhythm: Normal rate and regular rhythm.     Heart sounds: Normal heart sounds.  Pulmonary:     Effort: Pulmonary effort is normal. No respiratory distress.     Breath sounds: Normal breath sounds.  Abdominal:     Tenderness: There is no abdominal tenderness.  Skin:    Comments: Receding hair line  Neurological:     Mental Status: She is alert and oriented to person, place, and time.  Psychiatric:        Mood and Affect: Mood normal.        Behavior: Behavior normal.    BP 130/76   Pulse 73   Temp (!) 97.2 F (36.2 C)   Ht $R'5\' 5"'BD$  (1.651 m)   Wt 224 lb (101.6 kg)   SpO2 96%   BMI 37.28 kg/m  Wt Readings from Last 3 Encounters:  09/19/21 224 lb (101.6 kg)  07/10/21 218 lb (98.9 kg)  11/11/20 215 lb 3.2 oz (97.6 kg)    Health Maintenance Due  Topic Date Due   HIV Screening  Never done   Hepatitis C Screening  Never done   PAP SMEAR-Modifier  Never done   COVID-19 Vaccine (3 - Booster for Pfizer series) 03/28/2020   INFLUENZA VACCINE  Never done    There are no preventive care reminders to display for this patient.   Lab Results  Component Value  Date   TSH 1.360 07/10/2021   Lab Results  Component Value Date   WBC 4.9 07/10/2021   HGB 16.3 (H) 07/10/2021   HCT 48.5 (H) 07/10/2021   MCV 90 07/10/2021   PLT 226 07/10/2021   Lab Results  Component Value Date   NA 141 07/10/2021   K 4.3 07/10/2021   CO2 22 07/10/2021   GLUCOSE 99 07/10/2021   BUN 15 07/10/2021   CREATININE 0.83 07/10/2021   BILITOT 0.9 07/10/2021   ALKPHOS 62 07/10/2021   AST 20 07/10/2021   ALT 25 07/10/2021   PROT 6.6 07/10/2021   ALBUMIN 4.2 07/10/2021   CALCIUM 9.2 07/10/2021   EGFR 79 07/10/2021   Lab Results  Component Value Date   CHOL 159 07/10/2021   Lab Results  Component Value Date   HDL 41 07/10/2021   Lab Results  Component Value Date   LDLCALC 96 07/10/2021   Lab Results  Component Value Date   TRIG 121 07/10/2021   Lab Results  Component Value Date   CHOLHDL 3.9 07/10/2021   No results found for: HGBA1C     Assessment & Plan:   Problem List Items Addressed This Visit       Endocrine   Postablative hypothyroidism - Primary   Other Visit Diagnoses     Alopecia       Relevant Orders   CBC with Differential/Platelet   Comprehensive metabolic panel   TSH   Ferritin      No orders of the defined types were placed in this encounter.   Orders Placed This Encounter  Procedures   CBC with Differential/Platelet   Comprehensive metabolic panel   TSH   Ferritin      Follow-up: Return in about 3 months (around 12/18/2021) for chronic fasting.  An After Visit Summary was printed and given to the patient.  Rochel Brome, MD [facility] [phone]

## 2021-09-20 LAB — CBC WITH DIFFERENTIAL/PLATELET
Basophils Absolute: 0.1 10*3/uL (ref 0.0–0.2)
Basos: 1 %
EOS (ABSOLUTE): 0.2 10*3/uL (ref 0.0–0.4)
Eos: 3 %
Hematocrit: 48.9 % — ABNORMAL HIGH (ref 34.0–46.6)
Hemoglobin: 16.5 g/dL — ABNORMAL HIGH (ref 11.1–15.9)
Immature Grans (Abs): 0 10*3/uL (ref 0.0–0.1)
Immature Granulocytes: 0 %
Lymphocytes Absolute: 1.6 10*3/uL (ref 0.7–3.1)
Lymphs: 32 %
MCH: 30.2 pg (ref 26.6–33.0)
MCHC: 33.7 g/dL (ref 31.5–35.7)
MCV: 90 fL (ref 79–97)
Monocytes Absolute: 0.6 10*3/uL (ref 0.1–0.9)
Monocytes: 11 %
Neutrophils Absolute: 2.6 10*3/uL (ref 1.4–7.0)
Neutrophils: 53 %
Platelets: 246 10*3/uL (ref 150–450)
RBC: 5.46 x10E6/uL — ABNORMAL HIGH (ref 3.77–5.28)
RDW: 12.9 % (ref 11.7–15.4)
WBC: 5 10*3/uL (ref 3.4–10.8)

## 2021-09-20 LAB — COMPREHENSIVE METABOLIC PANEL
ALT: 21 IU/L (ref 0–32)
AST: 22 IU/L (ref 0–40)
Albumin/Globulin Ratio: 1.7 (ref 1.2–2.2)
Albumin: 4.5 g/dL (ref 3.8–4.8)
Alkaline Phosphatase: 61 IU/L (ref 44–121)
BUN/Creatinine Ratio: 22 (ref 12–28)
BUN: 17 mg/dL (ref 8–27)
Bilirubin Total: 1 mg/dL (ref 0.0–1.2)
CO2: 26 mmol/L (ref 20–29)
Calcium: 9.6 mg/dL (ref 8.7–10.3)
Chloride: 103 mmol/L (ref 96–106)
Creatinine, Ser: 0.78 mg/dL (ref 0.57–1.00)
Globulin, Total: 2.6 g/dL (ref 1.5–4.5)
Glucose: 67 mg/dL — ABNORMAL LOW (ref 70–99)
Potassium: 4.6 mmol/L (ref 3.5–5.2)
Sodium: 141 mmol/L (ref 134–144)
Total Protein: 7.1 g/dL (ref 6.0–8.5)
eGFR: 85 mL/min/{1.73_m2} (ref >59)

## 2021-09-20 LAB — TSH: TSH: 1.9 u[IU]/mL (ref 0.450–4.500)

## 2021-09-20 LAB — FERRITIN: Ferritin: 296 ng/mL — ABNORMAL HIGH (ref 15–150)

## 2021-09-21 ENCOUNTER — Encounter: Payer: Self-pay | Admitting: Family Medicine

## 2021-09-25 ENCOUNTER — Encounter: Payer: Self-pay | Admitting: Family Medicine

## 2021-09-25 ENCOUNTER — Other Ambulatory Visit: Payer: Self-pay | Admitting: Family Medicine

## 2021-09-25 MED ORDER — SYNTHROID 125 MCG PO TABS: 125.0000 ug | ORAL_TABLET | Freq: Every day | ORAL | 3 refills | Status: DC

## 2021-10-10 ENCOUNTER — Encounter: Payer: Self-pay | Admitting: Legal Medicine

## 2021-10-10 ENCOUNTER — Telehealth (INDEPENDENT_AMBULATORY_CARE_PROVIDER_SITE_OTHER): Payer: PRIVATE HEALTH INSURANCE | Admitting: Legal Medicine

## 2021-10-10 VITALS — BP 161/97 | HR 73 | Temp 97.3°F | Ht 65.0 in | Wt 224.0 lb

## 2021-10-10 DIAGNOSIS — J0100 Acute maxillary sinusitis, unspecified: Secondary | ICD-10-CM | POA: Diagnosis not present

## 2021-10-10 LAB — HM MAMMOGRAPHY

## 2021-10-10 MED ORDER — AMOXICILLIN 875 MG PO TABS: 875.0000 mg | ORAL_TABLET | Freq: Two times a day (BID) | ORAL | 0 refills | Status: AC

## 2021-10-10 MED ORDER — PREDNISONE 10 MG (21) PO TBPK: ORAL_TABLET | ORAL | 0 refills | Status: DC

## 2021-10-10 NOTE — Progress Notes (Signed)
Virtual Visit via Video Note   This visit type was conducted due to national recommendations for restrictions regarding the COVID-19 Pandemic (e.g. social distancing) in an effort to limit this patient's exposure and mitigate transmission in our community.  Due to her co-morbid illnesses, this patient is at least at moderate risk for complications without adequate follow up.  This format is felt to be most appropriate for this patient at this time.  All issues noted in this document were discussed and addressed.  A limited physical exam was performed with this format.  A verbal consent was obtained for the virtual visit.   Date:  10/10/2021   ID:  Bridget Jones, DOB [birth year], MRN [id number]  Patient Location: Home Provider Location: Home Office  PCP:  Blane Ohara, MD   Evaluation Performed:  New Patient Evaluation  Chief Complaint:  sinus infection  History of Present Illness:    Bridget Jones is a 65 y.o. female with one week sinus infection. No fever or chills  The patient does not have symptoms concerning for COVID-19 infection (fever, chills, cough, or new shortness of breath).    Past Medical History:  Diagnosis Date   Hyperlipidemia    Hypertension     History reviewed. No pertinent surgical history.  Family History  Problem Relation Age of Onset   Hypertension Mother    Diabetes Father    Stroke Father    Hyperthyroidism Sister     Social History   Socioeconomic History   Marital status: Married    Spouse name: Not on file   Number of children: 2   Years of education: Not on file   Highest education level: Not on file  Occupational History   Not on file  Tobacco Use   Smoking status: Never   Smokeless tobacco: Never  Substance and Sexual Activity   Alcohol use: Yes    Comment: rarely ( one drink per month)   Drug use: Never   Sexual activity: Yes  Other Topics Concern   Not on file  Social History Narrative   Not on file   Social Determinants of  Health   Financial Resource Strain: Not on file  Food Insecurity: Not on file  Transportation Needs: Not on file  Physical Activity: Not on file  Stress: Not on file  Social Connections: Not on file  Intimate Partner Violence: Not on file    Outpatient Medications Prior to Visit  Medication Sig Dispense Refill   Biotin 5 MG CAPS      calcium carbonate (OSCAL) 1500 (600 Ca) MG TABS tablet Take by mouth 2 (two) times daily with a meal.     Magnesium Citrate 200 MG TABS      Multiple Vitamin (MULTIVITAMIN) tablet Take 1 tablet by mouth daily.     Omega-3 Fatty Acids (FISH OIL) 1000 MG CPDR Take by mouth.     SYNTHROID 125 MCG tablet Take 1 tablet (125 mcg total) by mouth daily before breakfast. 90 tablet 3   Turmeric (QC TUMERIC COMPLEX PO) Take by mouth.     atorvastatin (LIPITOR) 10 MG tablet Take 1 tablet (10 mg total) by mouth daily. 90 tablet 1   No facility-administered medications prior to visit.    Allergies:   Patient has no known allergies.   Social History   Tobacco Use   Smoking status: Never   Smokeless tobacco: Never  Substance Use Topics   Alcohol use: Yes    Comment: rarely (  one drink per month)   Drug use: Never     Review of Systems  Constitutional:  Negative for chills and fever.  HENT:  Positive for congestion and sinus pain.   Eyes:  Negative for double vision and photophobia.  Respiratory:  Negative for cough and hemoptysis.   Cardiovascular:  Negative for claudication.  Genitourinary:  Negative for dysuria.  Musculoskeletal:  Negative for myalgias.  Psychiatric/Behavioral:  Negative for depression.     Labs/Other Tests and Data Reviewed:    Recent Labs: 09/19/2021: ALT 21; BUN 17; Creatinine, Ser 0.78; Hemoglobin 16.5; Platelets 246; Potassium 4.6; Sodium 141; TSH 1.900   Recent Lipid Panel Lab Results  Component Value Date/Time   CHOL 159 07/10/2021 08:08 AM   TRIG 121 07/10/2021 08:08 AM   HDL 41 07/10/2021 08:08 AM   CHOLHDL 3.9  07/10/2021 08:08 AM   LDLCALC 96 07/10/2021 08:08 AM    Wt Readings from Last 3 Encounters:  10/10/21 224 lb (101.6 kg)  09/19/21 224 lb (101.6 kg)  07/10/21 218 lb (98.9 kg)     Objective:    Vital Signs:  BP (!) 161/97    Pulse 73    Temp (!) 97.3 F (36.3 C)    Ht 5\' 5"  (1.651 m)    Wt 224 lb (101.6 kg)    BMI 37.28 kg/m    Physical Exam reviewed  ASSESSMENT & PLAN:   Diagnoses and all orders for this visit: Acute non-recurrent maxillary sinusitis -     amoxicillin (AMOXIL) 875 MG tablet; Take 1 tablet (875 mg total) by mouth 2 (two) times daily for 10 days. -     predniSONE (STERAPRED UNI-PAK 21 TAB) 10 MG (21) TBPK tablet; Take 6ills first day , then 5 pills day 2 and then cut down one pill day until gone   patient has acute sinusitis and we will treat with amoxicillin and prednisone      COVID-19 Education: The signs and symptoms of COVID-19 were discussed with the patient and how to seek care for testing (follow up with PCP or arrange E-visit). The importance of social distancing was discussed today.   I spent 20 minutes dedicated to the care of this patient on the date of this encounter to include face-to-face time with the patient, as well as:   Follow Up:  In Person prn  Signed, , MD  10/10/2021 3:59 PM    [facility]

## 2021-10-23 ENCOUNTER — Encounter: Payer: Self-pay | Admitting: Family Medicine

## 2021-12-21 ENCOUNTER — Other Ambulatory Visit: Payer: PRIVATE HEALTH INSURANCE

## 2022-01-08 ENCOUNTER — Ambulatory Visit (INDEPENDENT_AMBULATORY_CARE_PROVIDER_SITE_OTHER): Payer: PRIVATE HEALTH INSURANCE | Admitting: Family Medicine

## 2022-01-08 ENCOUNTER — Encounter: Payer: Self-pay | Admitting: Family Medicine

## 2022-01-08 ENCOUNTER — Other Ambulatory Visit: Payer: Self-pay

## 2022-01-08 VITALS — BP 132/80 | HR 76 | Temp 97.2°F | Resp 16 | Ht 65.0 in | Wt 211.0 lb

## 2022-01-08 DIAGNOSIS — Z6835 Body mass index (BMI) 35.0-35.9, adult: Secondary | ICD-10-CM | POA: Diagnosis not present

## 2022-01-08 DIAGNOSIS — E782 Mixed hyperlipidemia: Secondary | ICD-10-CM

## 2022-01-08 DIAGNOSIS — E890 Postprocedural hypothyroidism: Secondary | ICD-10-CM

## 2022-01-08 NOTE — Assessment & Plan Note (Signed)
Check labs.  ?Recommend continue to work on eating healthy diet and exercise. ?Await labs/testing for assessment and recommendations. ? ?

## 2022-01-08 NOTE — Progress Notes (Signed)
? ?Subjective:  ?Patient ID: Bridget Jones, female    DOB: [birth year]  Age: 66 y.o. MRN: [id number] ? ?Chief Complaint  ?Patient presents with  ? Hypothyroidism  ? Hypertension  ? ? ?HPI: ?Hypothyroidism: Currently on synthroid 125 mcg once daily in am.  ?Hyperlipidemia: was on lipitor, but we stopped it due to her healthier lifestyle. Eating heatlhy. Exercising (walking neighborhood.) Here for recheck.  ?Patient is retired and enjoying her life. She and her husband are traveling the country in an RV and she is quilting.  ? ?Current Outpatient Medications on File Prior to Visit  ?Medication Sig Dispense Refill  ? Biotin 5 MG CAPS     ? calcium carbonate (OSCAL) 1500 (600 Ca) MG TABS tablet Take by mouth 2 (two) times daily with a meal.    ? Magnesium Citrate 200 MG TABS     ? Multiple Vitamin (MULTIVITAMIN) tablet Take 1 tablet by mouth daily.    ? Omega-3 Fatty Acids (FISH OIL) 1000 MG CPDR Take by mouth.    ? SYNTHROID 125 MCG tablet Take 1 tablet (125 mcg total) by mouth daily before breakfast. 90 tablet 3  ? Turmeric (QC TUMERIC COMPLEX PO) Take by mouth.    ? ?No current facility-administered medications on file prior to visit.  ? ?Past Medical History:  ?Diagnosis Date  ? Hyperlipidemia   ? Hypertension   ? ?History reviewed. No pertinent surgical history.  ?Family History  ?Problem Relation Age of Onset  ? Hypertension Mother   ? Diabetes Father   ? Stroke Father   ? Hyperthyroidism Sister   ? Hypothyroidism Sister   ? ?Social History  ? ?Socioeconomic History  ? Marital status: Married  ?  Spouse name: Not on file  ? Number of children: 2  ? Years of education: Not on file  ? Highest education level: Not on file  ?Occupational History  ? 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 Smoking status: Never  ? Smokeless tobacco: Never  ?Substance and Sexual Activity  ? Alcohol use: Yes  ?  Comment: rarely ( one drink per month)  ? Drug use: Never  ? Sexual activity: Yes  ?Other Topics Concern   ? Not on file  ?Social History Narrative  ? Not on file  ? ?Social Determinants of Health  ? ?Financial Resource Strain: Not on file  ?Food Insecurity: Not on file  ?Transportation Needs: Not on file  ?Physical Activity: Not on file  ?Stress: Not on file  ?Social Connections: Not on file  ? ? ?Review of Systems  ?Constitutional:  Negative for chills, fatigue and fever.  ?HENT:  Positive for congestion. Negative for rhinorrhea and sore throat.   ?Respiratory:  Positive for cough (PND). Negative for shortness of breath.   ?Cardiovascular:  Negative for chest pain and palpitations.  ?Gastrointestinal:  Negative for abdominal pain, constipation, diarrhea, nausea and vomiting.  ?Genitourinary:  Negative for dysuria and urgency.  ?Musculoskeletal:  Negative for back pain and myalgias.  ?Neurological:  Negative for dizziness, weakness, light-headedness and headaches.  ?Psychiatric/Behavioral:  Negative for dysphoric mood. The patient is not nervous/anxious.   ? ? ?Objective:  ?BP 132/80   Pulse 76   Temp (!) 97.2 ?F (36.2 ?C)   Resp 16   Ht 5\' 5"  (1.651 m)   Wt 211 lb (95.7 kg)   BMI 35.11 kg/m?  ? ? ?  01/08/2022  ?  7:31 AM 10/10/2021  ?  3:30  PM 09/19/2021  ?  8:57 AM  ?BP/Weight  ?Systolic BP [id number]  ?Diastolic BP 80 97 76  ?Wt. (Lbs) 211 224 224  ?BMI 35.11 kg/m2 37.28 kg/m2 37.28 kg/m2  ? ? ?Physical Exam ?Vitals reviewed.  ?Constitutional:   ?   Appearance: Normal appearance. She is obese.  ?Neck:  ?   Vascular: No carotid bruit.  ?Cardiovascular:  ?   Rate and Rhythm: Normal rate and regular rhythm.  ?   Heart sounds: Normal heart sounds.  ?Pulmonary:  ?   Effort: Pulmonary effort is normal. No respiratory distress.  ?   Breath sounds: Normal breath sounds.  ?Abdominal:  ?   General: Abdomen is flat. Bowel sounds are normal.  ?   Palpations: Abdomen is soft.  ?   Tenderness: There is no abdominal tenderness.  ?Neurological:  ?   Mental Status: She is alert and oriented to person, place, and time.   ?Psychiatric:     ?   Mood and Affect: Mood normal.     ?   Behavior: Behavior normal.  ? ? ?Diabetic Foot Exam - Simple   ?No data filed ?  ?  ? ?Lab Results  ?Component Value Date  ? WBC 5.0 09/19/2021  ? HGB 16.5 (H) 09/19/2021  ? HCT 48.9 (H) 09/19/2021  ? PLT 246 09/19/2021  ? GLUCOSE 67 (L) 09/19/2021  ? CHOL 159 07/10/2021  ? TRIG 121 07/10/2021  ? HDL 41 07/10/2021  ? [place] 96 07/10/2021  ? ALT 21 09/19/2021  ? AST 22 09/19/2021  ? NA 141 09/19/2021  ? K 4.6 09/19/2021  ? CL 103 09/19/2021  ? CREATININE 0.78 09/19/2021  ? BUN 17 09/19/2021  ? CO2 26 09/19/2021  ? TSH 1.900 09/19/2021  ? ? ? ? ?Assessment & Plan:  ? ?Problem List Items Addressed This Visit   ? ?  ? Endocrine  ? Postablative hypothyroidism  ?  Check tsh.  ?Await labs/testing for assessment and recommendations. ? ?  ?  ? Relevant Orders  ? TSH  ?  ? Other  ? Class 2 severe obesity due to excess calories with serious comorbidity and body mass index (BMI) of 35.0 to 35.9 in adult [facility])  ?  Recommend continue to work on eating healthy diet and exercise. ? ?  ?  ? Mixed hyperlipidemia - Primary  ?  Check labs.  ?Recommend continue to work on eating healthy diet and exercise. ?Await labs/testing for assessment and recommendations. ? ?  ?  ? Relevant Orders  ? CBC with Differential/Platelet  ? Comprehensive metabolic panel  ? Lipid panel  ?. ? ?No orders of the defined types were placed in this encounter. ? ? ?Orders Placed This Encounter  ?Procedures  ? CBC with Differential/Platelet  ? Comprehensive metabolic panel  ? Lipid panel  ? TSH  ?  ? ?Follow-up: Return in about 19 weeks (around 05/21/2022) for WELCOME TO MEDICARE VISIT with Dr. Tobie Poet. ? ?An After Visit Summary was printed and given to the patient. ? ?Rochel Brome, MD ?[place] ?[phone]

## 2022-01-08 NOTE — Assessment & Plan Note (Signed)
Recommend continue to work on eating healthy diet and exercise.  

## 2022-01-08 NOTE — Assessment & Plan Note (Signed)
Check tsh.  °Await labs/testing for assessment and recommendations. ° °

## 2022-01-08 NOTE — Patient Instructions (Signed)
Keep up the good work with your diet and your exercise!  ?See you in August.  ?

## 2022-01-09 LAB — COMPREHENSIVE METABOLIC PANEL
ALT: 16 IU/L (ref 0–32)
AST: 20 IU/L (ref 0–40)
Albumin/Globulin Ratio: 1.7 (ref 1.2–2.2)
Albumin: 4.3 g/dL (ref 3.8–4.8)
Alkaline Phosphatase: 59 IU/L (ref 44–121)
BUN/Creatinine Ratio: 14 (ref 12–28)
BUN: 13 mg/dL (ref 8–27)
Bilirubin Total: 0.7 mg/dL (ref 0.0–1.2)
CO2: 23 mmol/L (ref 20–29)
Calcium: 9 mg/dL (ref 8.7–10.3)
Chloride: 102 mmol/L (ref 96–106)
Creatinine, Ser: 0.91 mg/dL (ref 0.57–1.00)
Globulin, Total: 2.5 g/dL (ref 1.5–4.5)
Glucose: 89 mg/dL (ref 70–99)
Potassium: 4.3 mmol/L (ref 3.5–5.2)
Sodium: 140 mmol/L (ref 134–144)
Total Protein: 6.8 g/dL (ref 6.0–8.5)
eGFR: 70 mL/min/{1.73_m2} (ref >59)

## 2022-01-09 LAB — CBC WITH DIFFERENTIAL/PLATELET
Basophils Absolute: 0 10*3/uL (ref 0.0–0.2)
Basos: 1 %
EOS (ABSOLUTE): 0.1 10*3/uL (ref 0.0–0.4)
Eos: 2 %
Hematocrit: 51.2 % — ABNORMAL HIGH (ref 34.0–46.6)
Hemoglobin: 17 g/dL — ABNORMAL HIGH (ref 11.1–15.9)
Immature Grans (Abs): 0 10*3/uL (ref 0.0–0.1)
Immature Granulocytes: 0 %
Lymphocytes Absolute: 1.2 10*3/uL (ref 0.7–3.1)
Lymphs: 20 %
MCH: 29.4 pg (ref 26.6–33.0)
MCHC: 33.2 g/dL (ref 31.5–35.7)
MCV: 89 fL (ref 79–97)
Monocytes Absolute: 0.6 10*3/uL (ref 0.1–0.9)
Monocytes: 9 %
Neutrophils Absolute: 4.1 10*3/uL (ref 1.4–7.0)
Neutrophils: 68 %
Platelets: 250 10*3/uL (ref 150–450)
RBC: 5.78 x10E6/uL — ABNORMAL HIGH (ref 3.77–5.28)
RDW: 12.9 % (ref 11.7–15.4)
WBC: 6.1 10*3/uL (ref 3.4–10.8)

## 2022-01-09 LAB — LIPID PANEL
Chol/HDL Ratio: 5 ratio — ABNORMAL HIGH (ref 0.0–4.4)
Cholesterol, Total: 217 mg/dL — ABNORMAL HIGH (ref 100–199)
HDL: 43 mg/dL (ref >39)
LDL Chol Calc (NIH): 150 mg/dL — ABNORMAL HIGH (ref 0–99)
Triglycerides: 133 mg/dL (ref 0–149)
VLDL Cholesterol Cal: 24 mg/dL (ref 5–40)

## 2022-01-09 LAB — CARDIOVASCULAR RISK ASSESSMENT

## 2022-01-09 LAB — TSH: TSH: 3.64 u[IU]/mL (ref 0.450–4.500)

## 2022-01-10 NOTE — Progress Notes (Signed)
Blood count normal.  hb little up, but stable.  ?Liver function normal.  ?Kidney function normal.  ?Thyroid function normal.  ?Cholesterol: LDL jumped way back up to 443. Goal is less than 100. I would recommend she restart lipitor 10 mg before bed. 90/1 refill please.  ?Keep appts already made fasting.

## 2022-01-12 ENCOUNTER — Other Ambulatory Visit: Payer: Self-pay

## 2022-01-12 MED ORDER — ATORVASTATIN CALCIUM 10 MG PO TABS: 10.0000 mg | ORAL_TABLET | Freq: Every day | ORAL | 1 refills | Status: DC

## 2022-06-20 ENCOUNTER — Encounter: Payer: Self-pay | Admitting: Family Medicine

## 2022-06-20 ENCOUNTER — Ambulatory Visit (INDEPENDENT_AMBULATORY_CARE_PROVIDER_SITE_OTHER): Payer: Medicare Other | Admitting: Family Medicine

## 2022-06-20 VITALS — BP 128/86 | HR 76 | Temp 97.4°F | Resp 14 | Ht 65.0 in | Wt 215.0 lb

## 2022-06-20 DIAGNOSIS — Z13820 Encounter for screening for osteoporosis: Secondary | ICD-10-CM

## 2022-06-20 DIAGNOSIS — E890 Postprocedural hypothyroidism: Secondary | ICD-10-CM | POA: Diagnosis not present

## 2022-06-20 DIAGNOSIS — Z0001 Encounter for general adult medical examination with abnormal findings: Secondary | ICD-10-CM

## 2022-06-20 DIAGNOSIS — E66811 Obesity, class 1: Secondary | ICD-10-CM | POA: Insufficient documentation

## 2022-06-20 DIAGNOSIS — Z1211 Encounter for screening for malignant neoplasm of colon: Secondary | ICD-10-CM

## 2022-06-20 DIAGNOSIS — E782 Mixed hyperlipidemia: Secondary | ICD-10-CM | POA: Diagnosis not present

## 2022-06-20 DIAGNOSIS — Z23 Encounter for immunization: Secondary | ICD-10-CM

## 2022-06-20 DIAGNOSIS — Z124 Encounter for screening for malignant neoplasm of cervix: Secondary | ICD-10-CM

## 2022-06-20 DIAGNOSIS — Z114 Encounter for screening for human immunodeficiency virus [HIV]: Secondary | ICD-10-CM

## 2022-06-20 DIAGNOSIS — Z6836 Body mass index (BMI) 36.0-36.9, adult: Secondary | ICD-10-CM | POA: Insufficient documentation

## 2022-06-20 DIAGNOSIS — R7301 Impaired fasting glucose: Secondary | ICD-10-CM | POA: Diagnosis not present

## 2022-06-20 DIAGNOSIS — Z1159 Encounter for screening for other viral diseases: Secondary | ICD-10-CM | POA: Insufficient documentation

## 2022-06-20 NOTE — Assessment & Plan Note (Signed)
Recommend continue to work on eating healthy diet and exercise. Await labs/testing for assessment and recommendations.  

## 2022-06-20 NOTE — Assessment & Plan Note (Signed)
Check pap

## 2022-06-20 NOTE — Assessment & Plan Note (Signed)
Recommend continue to work on eating healthy diet and exercise.  

## 2022-06-20 NOTE — Assessment & Plan Note (Signed)
Ordered DEXA. Continue calcium caltrate.

## 2022-06-20 NOTE — Assessment & Plan Note (Signed)
  Things to do to keep yourself healthy  - Exercise at least 30-45 minutes a day, 3-4 days a week.  - Eat a low-fat diet with lots of fruits and vegetables, up to 7-9 servings per day.  - Seatbelts can save your life. Wear them always.  - Smoke detectors on every level of your home, check batteries every year.  - Eye Doctor - have an eye exam every 1-2 years  - Alcohol -  If you drink, do it moderately, less than 2 drinks per day.  - Health Care Power of Attorney. Choose someone to speak for you if you are not able. Please bring a copy when completed. - Depression is common in our stressful world.If you're feeling down or losing interest in things you normally enjoy, please come in for a visit.  - Violence - If anyone is threatening or hurting you, please call immediately.

## 2022-06-20 NOTE — Progress Notes (Signed)
Subjective:  Patient ID: Bridget Jones, female    DOB: 1956/02/27  Age: 66 y.o. MRN: [id number]  Chief Complaint  Patient presents with   Annual Exam    HPI Well Adult Physical: Patient here for a comprehensive physical exam.The patient reports no problems Do you take any herbs or supplements that were not prescribed by a doctor?magnesium citate, fish oil 1000 mg once daily, biotin 5 mg once daily, turmeric once daily, MVI daily, Are you taking calcium supplements? Calcium caltrate 1200 mg once daily.  Are you taking aspirin daily? no  Encounter for general adult medical examination without abnormal findings  Physical ("At Risk" items are starred): Patient's last physical exam was 1 year ago .  Patient is not afflicted from Stress Incontinence and Urge Incontinence  Patient wears a seat belt, has smoke detectors, has carbon monoxide detectors, practices appropriate gun safety, and wears sunscreen with extended sun exposure. Dental Care: biannual cleanings, brushes and flosses daily. Ophthalmology/Optometry: Annual visit.  Hearing loss: none Vision impairments: wear glasses   Flowsheet Row Office Visit from 06/20/2022 in [facility]  PHQ-2 Total Score 0        Social Hx   Social History   Socioeconomic History   Marital status: Married    Spouse name: Not on file   Number of children: 2   Years of education: Not on file   Highest education level: Not on file  Occupational History   Occupation: human resources    Comment: Retired  Tobacco Use   Smoking status: Never   Smokeless tobacco: Never  Substance and Sexual Activity   Alcohol use: Yes    Comment: rarely ( one drink per month)   Drug use: Never   Sexual activity: Yes  Other Topics Concern   Not on file  Social History Narrative   Not on file   Social Determinants of Health   Financial Resource Strain: Low Risk  (06/20/2022)   Overall Financial Resource Strain (CARDIA)    Difficulty of Paying Living  Expenses: Not hard at all  Food Insecurity: No Food Insecurity (06/20/2022)   Hunger Vital Sign    Worried About Running Out of Food in the Last Year: Never true    Ran Out of Food in the Last Year: Never true  Transportation Needs: No Transportation Needs (06/20/2022)   PRAPARE - Administrator, Civil Service (Medical): No    Lack of Transportation (Non-Medical): No  Physical Activity: Sufficiently Active (06/20/2022)   Exercise Vital Sign    Days of Exercise per Week: 7 days    Minutes of Exercise per Session: 30 min  Stress: No Stress Concern Present (06/20/2022)   [employer] of Occupational Health - Occupational Stress Questionnaire    Feeling of Stress : Not at all  Social Connections: Socially Integrated (06/20/2022)   Social Connection and Isolation Panel [NHANES]    Frequency of Communication with Friends and Family: More than three times a week    Frequency of Social Gatherings with Friends and Family: Twice a week    Attends Religious Services: More than 4 times per year    Active Member of [employer] or Organizations: Yes    Attends Banker Meetings: 1 to 4 times per year    Marital Status: Married   Past Medical History:  Diagnosis Date   Hyperlipidemia    Hypertension    History reviewed. No pertinent surgical history.  Family History  Problem Relation Age  of Onset   Hypertension Mother    Diabetes Father    Stroke Father    Hyperthyroidism Sister    Hypothyroidism Sister     Review of Systems  Constitutional:  Negative for chills, fatigue and fever.  HENT:  Negative for congestion, rhinorrhea and sore throat.   Respiratory:  Negative for cough and shortness of breath.   Cardiovascular:  Negative for chest pain.  Gastrointestinal:  Negative for abdominal pain, constipation, diarrhea, nausea and vomiting.  Genitourinary:  Negative for dysuria and urgency.  Musculoskeletal:  Positive for arthralgias (right knee pain). Negative for back pain  and myalgias.  Neurological:  Negative for dizziness, weakness, light-headedness and headaches.  Psychiatric/Behavioral:  Negative for dysphoric mood. The patient is not nervous/anxious.      Objective:  BP 128/86   Pulse 76   Temp (!) 97.4 F (36.3 C)   Resp 14   Ht 5\' 5"  (1.651 m)   Wt 215 lb (97.5 kg)   BMI 35.78 kg/m      06/20/2022    9:00 AM 01/08/2022    7:31 AM 10/10/2021    3:30 PM  BP/Weight  Systolic BP 128 132 161  Diastolic BP 86 80 97  Wt. (Lbs) 215 211 224  BMI 35.78 kg/m2 35.11 kg/m2 37.28 kg/m2    Physical Exam Vitals reviewed. Exam conducted with a chaperone present.  Constitutional:      General: She is not in acute distress.    Appearance: Normal appearance. She is obese.  HENT:     Right Ear: Tympanic membrane and ear canal normal.     Left Ear: Tympanic membrane and ear canal normal.     Nose: Nose normal. No congestion or rhinorrhea.     Mouth/Throat:     Pharynx: No oropharyngeal exudate or posterior oropharyngeal erythema.  Eyes:     Conjunctiva/sclera: Conjunctivae normal.  Neck:     Thyroid: No thyroid mass.     Vascular: No carotid bruit.  Cardiovascular:     Rate and Rhythm: Normal rate and regular rhythm.     Heart sounds: Normal heart sounds. No murmur heard. Pulmonary:     Effort: Pulmonary effort is normal.     Breath sounds: Normal breath sounds.  Abdominal:     General: Bowel sounds are normal.     Palpations: Abdomen is soft. There is no mass.     Tenderness: There is no abdominal tenderness.  Genitourinary:    General: Normal vulva.     Exam position: Lithotomy position.     Comments: Pap sent. Lymphadenopathy:     Cervical: No cervical adenopathy.  Skin:    General: Skin is dry.     Findings: No lesion.  Neurological:     Mental Status: She is alert and oriented to person, place, and time.     Cranial Nerves: No cranial nerve deficit.  Psychiatric:        Mood and Affect: Mood normal.        Behavior: Behavior  normal.     Lab Results  Component Value Date   WBC 6.1 01/08/2022   HGB 17.0 (H) 01/08/2022   HCT 51.2 (H) 01/08/2022   PLT 250 01/08/2022   GLUCOSE 89 01/08/2022   CHOL 217 (H) 01/08/2022   TRIG 133 01/08/2022   HDL 43 01/08/2022   LDLCALC 150 (H) 01/08/2022   ALT 16 01/08/2022   AST 20 01/08/2022   NA 140 01/08/2022   K 4.3 01/08/2022  CL 102 01/08/2022   CREATININE 0.91 01/08/2022   BUN 13 01/08/2022   CO2 23 01/08/2022   TSH 3.640 01/08/2022      Assessment & Plan:   Problem List Items Addressed This Visit       Endocrine   Postablative hypothyroidism    Previously well controlled Continue Synthroid at current dose  Recheck TSH and adjust Synthroid as indicated        Relevant Orders   TSH     Other   Mixed hyperlipidemia    Recommend continue to work on eating healthy diet and exercise. Await labs/testing for assessment and recommendations.       Relevant Orders   CBC with Differential/Platelet   Comprehensive metabolic panel   Lipid panel   Need for pneumococcal 20-valent conjugate vaccination - Primary   Relevant Orders   Pneumococcal conjugate vaccine 20-valent (Prevnar 20) (Completed)   Encounter for routine adult medical exam with abnormal findings     Things to do to keep yourself healthy  - Exercise at least 30-45 minutes a day, 3-4 days a week.  - Eat a low-fat diet with lots of fruits and vegetables, up to 7-9 servings per day.  - Seatbelts can save your life. Wear them always.  - Smoke detectors on every level of your home, check batteries every year.  - Eye Doctor - have an eye exam every 1-2 years  - Alcohol -  If you drink, do it moderately, less than 2 drinks per day.  - Health Care Power of Attorney. Choose someone to speak for you if you are not able. Please bring a copy when completed. - Depression is common in our stressful world.If you're feeling down or losing interest in things you normally enjoy, please come in for a  visit.  - Violence - If anyone is threatening or hurting you, please call immediately.       Relevant Orders   EKG 12-Lead   CBC with Differential/Platelet   Comprehensive metabolic panel   Lipid panel   TSH   Screen for colon cancer   Relevant Orders   Cologuard   Class 2 severe obesity with body mass index (BMI) of 35 to 39.9 with serious comorbidity (HCC)    Recommend continue to work on eating healthy diet and exercise.       Relevant Orders   CBC with Differential/Platelet   Comprehensive metabolic panel   Lipid panel   TSH   Osteoporosis screening    Ordered DEXA. Continue calcium caltrate.       Relevant Orders   DG Bone Density   Encounter for screening for HIV   Relevant Orders   HIV Antibody (routine testing w rflx)   Need for hepatitis C screening test   Relevant Orders   HCV Ab w Reflex to Quant PCR   Cervical cancer screening    Check pap.       Relevant Orders   IGP, Aptima HPV, rfx 16/18,45      Body mass index is 35.78 kg/m.   These are the goals we discussed:  Goals      Weight (lb) < 190 lb (86.2 kg)     Work on diet. Limit carbohydrates.  Would like to get below 150 lbs.          This is a list of the screening recommended for you and due dates:  Health Maintenance  Topic Date Due   Hepatitis C Screening: USPSTF Recommendation to  screen - Ages 41-79 yo.  Never done   Pap Smear  Never done   COVID-19 Vaccine (3 - Pfizer series) 03/28/2020   Cologuard (Stool DNA test)  10/27/2021   DEXA scan (bone density measurement)  Never done   Flu Shot  Never done   Mammogram  10/11/2023   Tetanus Vaccine  05/24/2029   Pneumonia Vaccine  Completed   HIV Screening  Completed   Zoster (Shingles) Vaccine  Completed   HPV Vaccine  Aged Out     No orders of the defined types were placed in this encounter.   Follow-up: Return in about 3 months (around 09/19/2022) for chronic fasting.  An After Visit Summary was printed and given to the  patient.  Blane Ohara, MD [facility] [phone]

## 2022-06-20 NOTE — Assessment & Plan Note (Signed)
Previously well controlled Continue Synthroid at current dose  Recheck TSH and adjust Synthroid as indicated   

## 2022-06-20 NOTE — Patient Instructions (Addendum)
Ms. Senseney , Thank you for taking time to come for your Medicare Wellness Visit. I appreciate your ongoing commitment to your health goals. Please review the following plan we discussed and let me know if I can assist you in the future.   These are the goals we discussed:  Goals      Weight (lb) < 190 lb (86.2 kg)     Work on diet. Limit carbohydrates.  Would like to get below 150 lbs.         Things to do to keep yourself healthy  - Exercise at least 30-45 minutes a day, 3-4 days a week.  - Eat a low-fat diet with lots of fruits and vegetables, up to 7-9 servings per day.  - Seatbelts can save your life. Wear them always.  - Smoke detectors on every level of your home, check batteries every year.  - Eye Doctor - have an eye exam every 1-2 years  - Alcohol -  If you drink, do it moderately, less than 2 drinks per day.  - Health Care Power of Attorney. Choose someone to speak for you if you are not able. Please bring a copy when completed. - Depression is common in our stressful world.If you're feeling down or losing interest in things you normally enjoy, please come in for a visit.  - Violence - If anyone is threatening or hurting you, please call immediately.   This is a list of the screening recommended for you and due dates:  Health Maintenance  Topic Date Due   Hepatitis C Screening: USPSTF Recommendation to screen - Ages 49-79 yo.  Never done   Pap Smear  Never done   COVID-19 Vaccine (3 - Pfizer series) 03/28/2020   Cologuard (Stool DNA test)  10/27/2021   Pneumonia Vaccine (1 - PCV) Never done   DEXA scan (bone density measurement)  Never done   Flu Shot  Never done   Mammogram  10/11/2023   Tetanus Vaccine  05/24/2029   HIV Screening  Completed   Zoster (Shingles) Vaccine  Completed   HPV Vaccine  Aged Out

## 2022-06-21 LAB — HCV INTERPRETATION

## 2022-06-21 LAB — CBC WITH DIFFERENTIAL/PLATELET
Basophils Absolute: 0.1 10*3/uL (ref 0.0–0.2)
Basos: 1 %
EOS (ABSOLUTE): 0.4 10*3/uL (ref 0.0–0.4)
Eos: 8 %
Hematocrit: 50.5 % — ABNORMAL HIGH (ref 34.0–46.6)
Hemoglobin: 16.7 g/dL — ABNORMAL HIGH (ref 11.1–15.9)
Immature Grans (Abs): 0 10*3/uL (ref 0.0–0.1)
Immature Granulocytes: 0 %
Lymphocytes Absolute: 1.2 10*3/uL (ref 0.7–3.1)
Lymphs: 27 %
MCH: 30.1 pg (ref 26.6–33.0)
MCHC: 33.1 g/dL (ref 31.5–35.7)
MCV: 91 fL (ref 79–97)
Monocytes Absolute: 0.4 10*3/uL (ref 0.1–0.9)
Monocytes: 8 %
Neutrophils Absolute: 2.5 10*3/uL (ref 1.4–7.0)
Neutrophils: 56 %
Platelets: 250 10*3/uL (ref 150–450)
RBC: 5.54 x10E6/uL — ABNORMAL HIGH (ref 3.77–5.28)
RDW: 13 % (ref 11.7–15.4)
WBC: 4.5 10*3/uL (ref 3.4–10.8)

## 2022-06-21 LAB — COMPREHENSIVE METABOLIC PANEL
ALT: 24 IU/L (ref 0–32)
AST: 23 IU/L (ref 0–40)
Albumin/Globulin Ratio: 1.7 (ref 1.2–2.2)
Albumin: 4.4 g/dL (ref 3.9–4.9)
Alkaline Phosphatase: 61 IU/L (ref 44–121)
BUN/Creatinine Ratio: 18 (ref 12–28)
BUN: 13 mg/dL (ref 8–27)
Bilirubin Total: 0.9 mg/dL (ref 0.0–1.2)
CO2: 21 mmol/L (ref 20–29)
Calcium: 9.4 mg/dL (ref 8.7–10.3)
Chloride: 104 mmol/L (ref 96–106)
Creatinine, Ser: 0.73 mg/dL (ref 0.57–1.00)
Globulin, Total: 2.6 g/dL (ref 1.5–4.5)
Glucose: 103 mg/dL — ABNORMAL HIGH (ref 70–99)
Potassium: 4.6 mmol/L (ref 3.5–5.2)
Sodium: 140 mmol/L (ref 134–144)
Total Protein: 7 g/dL (ref 6.0–8.5)
eGFR: 91 mL/min/{1.73_m2} (ref >59)

## 2022-06-21 LAB — LIPID PANEL
Chol/HDL Ratio: 5.3 ratio — ABNORMAL HIGH (ref 0.0–4.4)
Cholesterol, Total: 265 mg/dL — ABNORMAL HIGH (ref 100–199)
HDL: 50 mg/dL (ref >39)
LDL Chol Calc (NIH): 196 mg/dL — ABNORMAL HIGH (ref 0–99)
Triglycerides: 105 mg/dL (ref 0–149)
VLDL Cholesterol Cal: 19 mg/dL (ref 5–40)

## 2022-06-21 LAB — HIV ANTIBODY (ROUTINE TESTING W REFLEX): HIV Screen 4th Generation wRfx: NONREACTIVE

## 2022-06-21 LAB — TSH: TSH: 1.95 u[IU]/mL (ref 0.450–4.500)

## 2022-06-21 LAB — HCV AB W REFLEX TO QUANT PCR: HCV Ab: NONREACTIVE

## 2022-06-21 LAB — CARDIOVASCULAR RISK ASSESSMENT

## 2022-06-21 NOTE — Progress Notes (Signed)
Blood count normal.  Liver function normal.  Kidney function normal.  Thyroid function normal.  Cholesterol: VERY High. Strongly recommend add crestor 20 mg before bed.  Sugar up a little. Add a1c.

## 2022-06-25 LAB — IGP, APTIMA HPV, RFX 16/18,45
HPV Aptima: NEGATIVE
PAP Smear Comment: 0

## 2022-06-25 LAB — SPECIMEN STATUS REPORT

## 2022-06-27 ENCOUNTER — Encounter: Payer: Self-pay | Admitting: Family Medicine

## 2022-06-27 DIAGNOSIS — H5213 Myopia, bilateral: Secondary | ICD-10-CM | POA: Diagnosis not present

## 2022-07-14 LAB — HGB A1C W/O EAG: Hgb A1c MFr Bld: 5.1 % (ref 4.8–5.6)

## 2022-07-14 LAB — SPECIMEN STATUS REPORT

## 2022-07-25 ENCOUNTER — Other Ambulatory Visit: Payer: Self-pay | Admitting: Family Medicine

## 2022-08-29 DIAGNOSIS — M85852 Other specified disorders of bone density and structure, left thigh: Secondary | ICD-10-CM | POA: Diagnosis not present

## 2022-08-29 DIAGNOSIS — N959 Unspecified menopausal and perimenopausal disorder: Secondary | ICD-10-CM | POA: Diagnosis not present

## 2022-09-20 ENCOUNTER — Other Ambulatory Visit: Payer: Self-pay

## 2022-09-20 DIAGNOSIS — Z13820 Encounter for screening for osteoporosis: Secondary | ICD-10-CM

## 2022-09-22 IMAGING — MG DIGITAL SCREENING BILAT W/ TOMO W/ CAD
8 series · 8 of 24 positions shown · non-contrast
Comparison: Previous exam(s).

CLINICAL DATA: Screening.

EXAM:
DIGITAL SCREENING BILATERAL MAMMOGRAM WITH TOMO AND CAD

[R CC synth-2D]
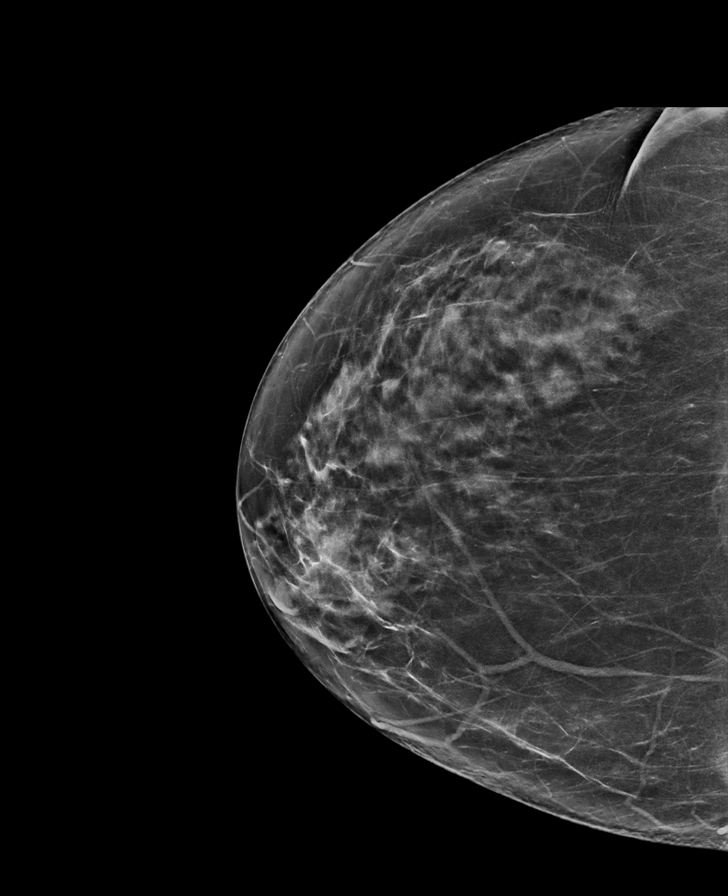

[R MLO synth-2D]
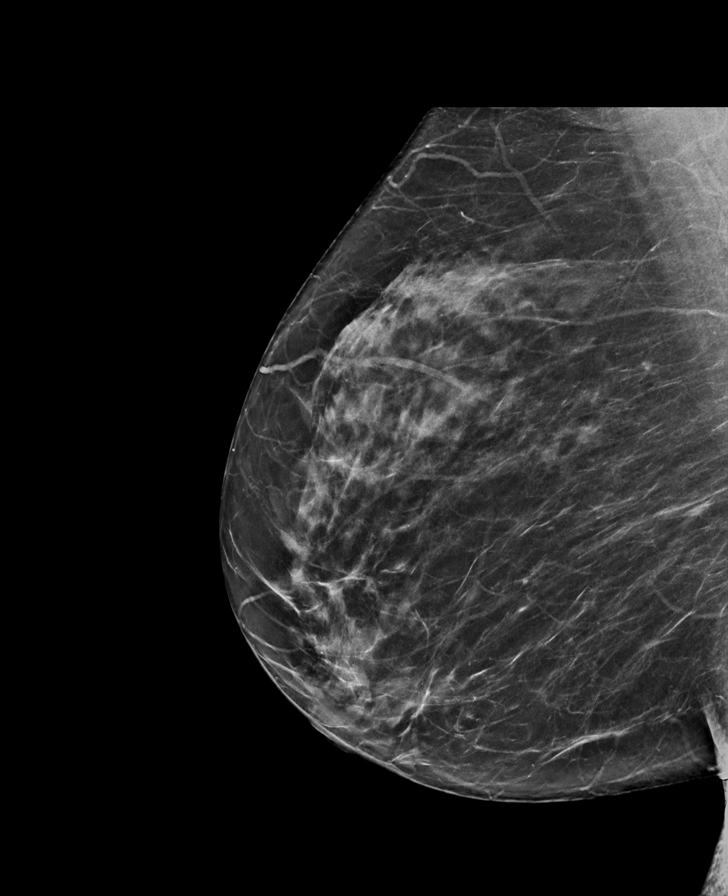

[L CC synth-2D]
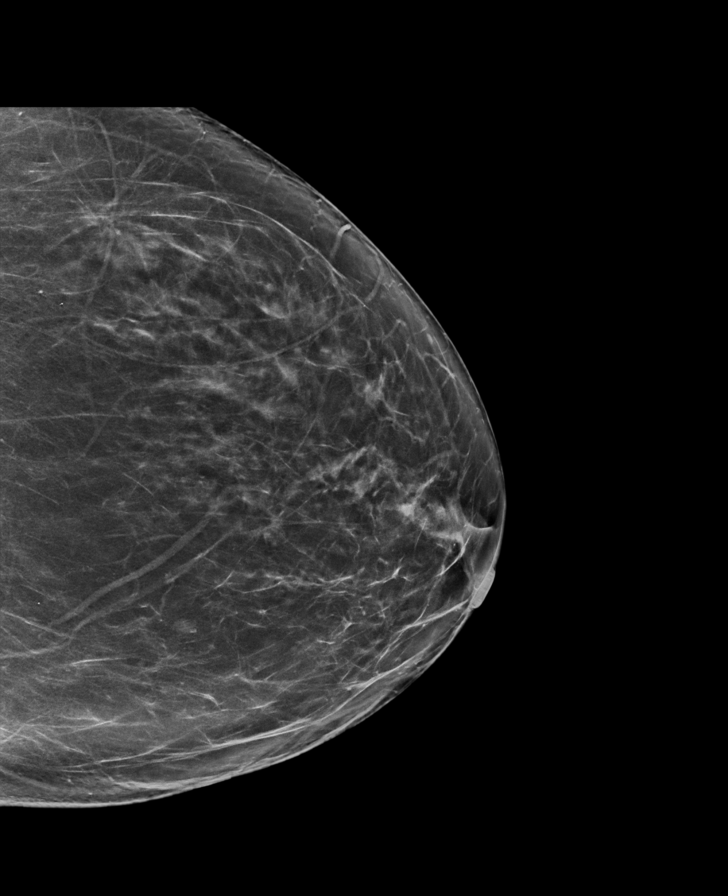

[L MLO synth-2D]
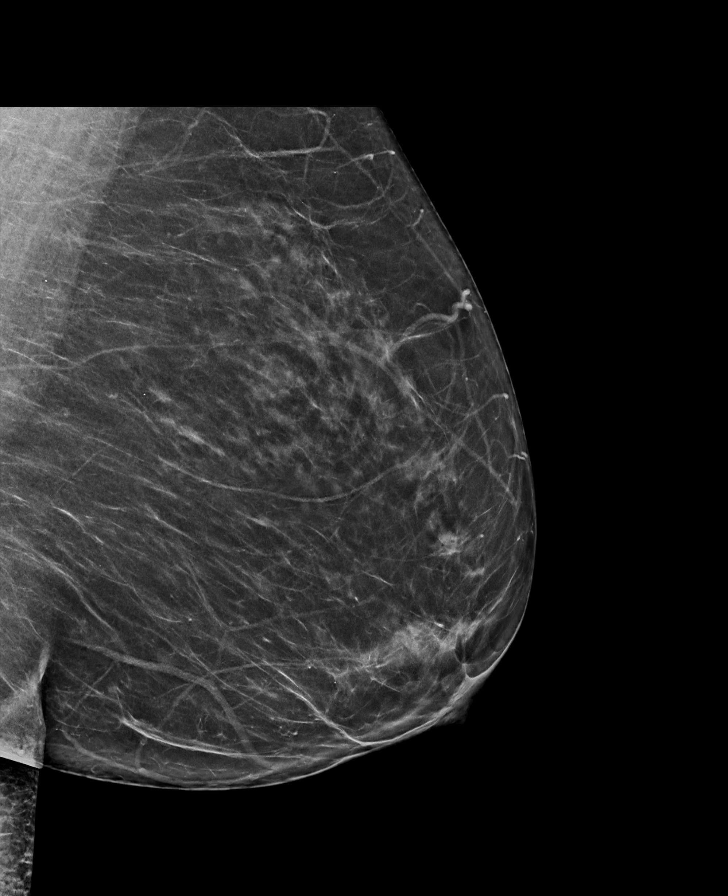

[R MLO tomo · tomo slice 38/75.0]
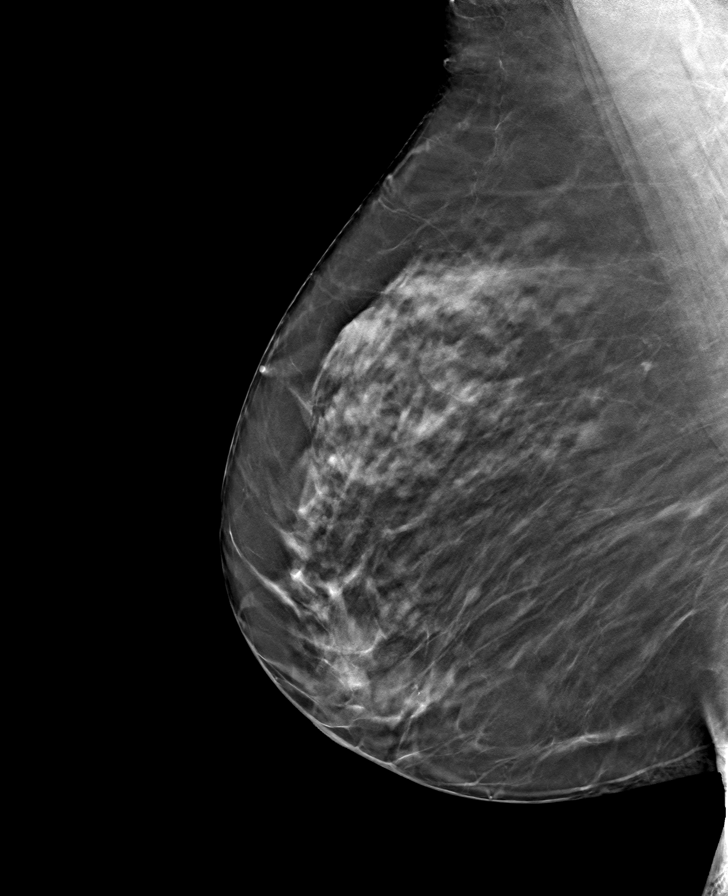

[L CC tomo · tomo slice 40/79.0]
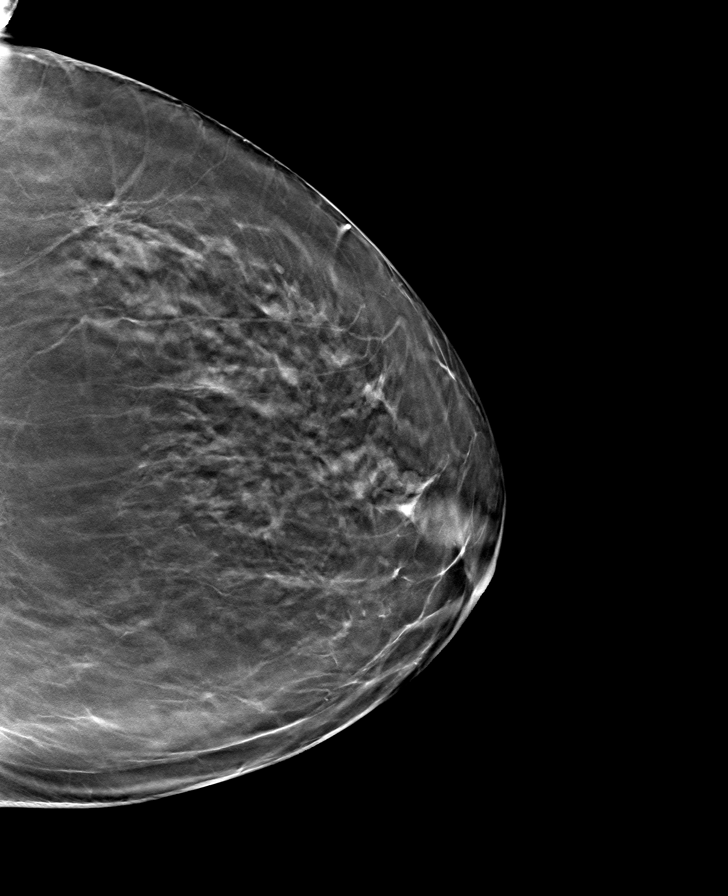

[R CC tomo · tomo slice 39/77.0]
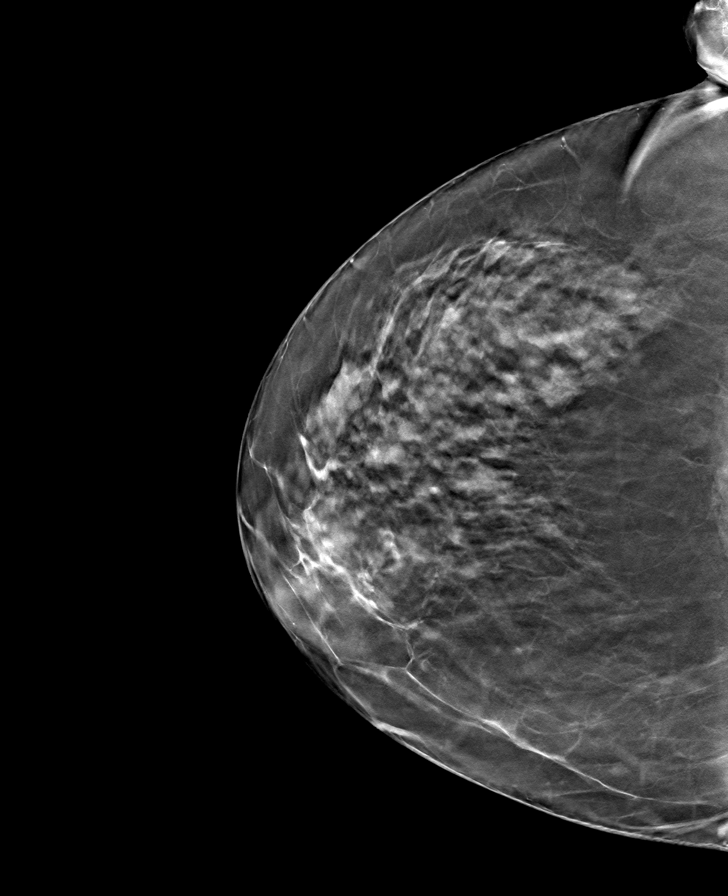

[L MLO tomo · tomo slice 38/75.0]
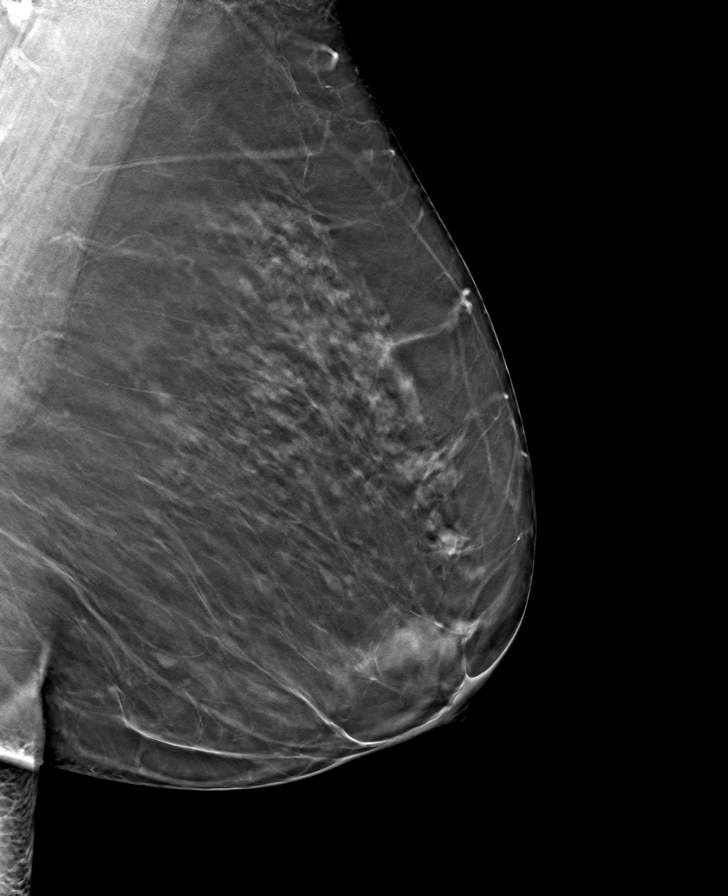

[8 of 24 positions shown; findings below may reference images not displayed]

ACR Breast Density Category b: There are scattered areas of
fibroglandular density.
FINDINGS: There are no findings suspicious for malignancy. Images were
processed with CAD.
IMPRESSION: No mammographic evidence of malignancy. A result letter of this
screening mammogram will be mailed directly to the patient.

RECOMMENDATION:
Screening mammogram in one year. (Code:[id number])

BI-RADS CATEGORY  1: Negative.

## 2022-09-26 ENCOUNTER — Encounter: Payer: Self-pay | Admitting: Family Medicine

## 2022-09-26 ENCOUNTER — Ambulatory Visit (INDEPENDENT_AMBULATORY_CARE_PROVIDER_SITE_OTHER): Payer: Medicare Other | Admitting: Family Medicine

## 2022-09-26 VITALS — BP 124/78 | HR 68 | Temp 97.3°F | Resp 14 | Ht 65.0 in | Wt 215.0 lb

## 2022-09-26 DIAGNOSIS — M8589 Other specified disorders of bone density and structure, multiple sites: Secondary | ICD-10-CM | POA: Diagnosis not present

## 2022-09-26 DIAGNOSIS — Z1211 Encounter for screening for malignant neoplasm of colon: Secondary | ICD-10-CM | POA: Diagnosis not present

## 2022-09-26 DIAGNOSIS — E782 Mixed hyperlipidemia: Secondary | ICD-10-CM

## 2022-09-26 DIAGNOSIS — I10 Essential (primary) hypertension: Secondary | ICD-10-CM

## 2022-09-26 DIAGNOSIS — E890 Postprocedural hypothyroidism: Secondary | ICD-10-CM | POA: Diagnosis not present

## 2022-09-26 NOTE — Assessment & Plan Note (Signed)
Reordered cologuard

## 2022-09-26 NOTE — Assessment & Plan Note (Signed)
Previously well controlled Continue Synthroid at current dose  

## 2022-09-26 NOTE — Assessment & Plan Note (Signed)
Order vitamin D

## 2022-09-26 NOTE — Patient Instructions (Signed)
Have a happy holiday! Dr. Sedalia Muta

## 2022-09-26 NOTE — Assessment & Plan Note (Signed)
Well controlled on no medicines.  Recommend continue to work on eating healthy diet and exercise.

## 2022-09-26 NOTE — Progress Notes (Signed)
Subjective:  Patient ID: Bridget Jones, female    DOB: 10-Feb-1956  Age: 66 y.o. MRN: [id number]  Chief Complaint  Patient presents with   Hypertension   Hypothyroidism    HPI Hypothyroidism: Currently on synthroid 125 mcg once daily in am. Last tsh therapeutic. Hyperlipidemia: was on lipitor, but we stopped it due to her healthier lifestyle and was causing issues with diarrhea. Last LDL 192.. Eating heatlhy. Exercising (walking neighborhood.) Here for recheck. Taking red yeast rice.  Patient is retired and enjoying her life. She and her husband are traveling the country in an RV and she is quilting.   Current Outpatient Medications on File Prior to Visit  Medication Sig Dispense Refill   calcium carbonate (OSCAL) 1500 (600 Ca) MG TABS tablet Take by mouth 2 (two) times daily with a meal.     Magnesium Citrate 200 MG TABS      Multiple Vitamin (MULTIVITAMIN) tablet Take 1 tablet by mouth daily.     psyllium (REGULOID) 0.52 g capsule Take 0.52 g by mouth daily.     Red Yeast Rice Extract 600 MG CAPS Take 1,200 mg by mouth daily.     SYNTHROID 125 MCG tablet TAKE 1 TABLET (125 MCG TOTAL) DAILY BEFORE BREAKFAST 90 tablet 3   Turmeric (QC TUMERIC COMPLEX PO) Take by mouth.     Omega-3 Fatty Acids (FISH OIL) 1000 MG CPDR Take by mouth.     No current facility-administered medications on file prior to visit.   Past Medical History:  Diagnosis Date   Hyperlipidemia    Hypertension    History reviewed. No pertinent surgical history.  Family History  Problem Relation Age of Onset   Hypertension Mother    Diabetes Father    Stroke Father    Hyperthyroidism Sister    Hypothyroidism Sister    Social History   Socioeconomic History   Marital status: Married    Spouse name: Not on file   Number of children: 2   Years of education: Not on file   Highest education level: Not on file  Occupational History   Occupation: human resources    Comment: Retired  Tobacco Use   Smoking  status: Never   Smokeless tobacco: Never  Substance and Sexual Activity   Alcohol use: Yes    Comment: rarely ( one drink per month)   Drug use: Never   Sexual activity: Yes  Other Topics Concern   Not on file  Social History Narrative   Not on file   Social Determinants of Health   Financial Resource Strain: Low Risk  (06/20/2022)   Overall Financial Resource Strain (CARDIA)    Difficulty of Paying Living Expenses: Not hard at all  Food Insecurity: No Food Insecurity (06/20/2022)   Hunger Vital Sign    Worried About Running Out of Food in the Last Year: Never true    Ran Out of Food in the Last Year: Never true  Transportation Needs: No Transportation Needs (06/20/2022)   PRAPARE - Administrator, Civil Service (Medical): No    Lack of Transportation (Non-Medical): No  Physical Activity: Sufficiently Active (06/20/2022)   Exercise Vital Sign    Days of Exercise per Week: 7 days    Minutes of Exercise per Session: 30 min  Stress: No Stress Concern Present (06/20/2022)   [employer] of Occupational Health - Occupational Stress Questionnaire    Feeling of Stress : Not at all  Social Connections: Socially Integrated (06/20/2022)  Social Advertising account executive [NHANES]    Frequency of Communication with Friends and Family: More than three times a week    Frequency of Social Gatherings with Friends and Family: Twice a week    Attends Religious Services: More than 4 times per year    Active Member of [employer] or Organizations: Yes    Attends Banker Meetings: 1 to 4 times per year    Marital Status: Married    Review of Systems  Constitutional:  Negative for chills, fatigue and fever.  HENT:  Negative for congestion, rhinorrhea and sore throat.   Respiratory:  Negative for cough and shortness of breath.   Cardiovascular:  Negative for chest pain.  Gastrointestinal:  Negative for abdominal pain, constipation, diarrhea, nausea and vomiting.   Genitourinary:  Negative for dysuria and urgency.  Musculoskeletal:  Positive for arthralgias (bilateral knee pain, bilateral thumb pain). Negative for back pain and myalgias.  Neurological:  Positive for headaches. Negative for dizziness, weakness and light-headedness.  Psychiatric/Behavioral:  Positive for sleep disturbance. Negative for dysphoric mood. The patient is not nervous/anxious.     Objective:  BP 124/78   Pulse 68   Temp (!) 97.3 F (36.3 C)   Resp 14   Ht 5\' 5"  (1.651 m)   Wt 215 lb (97.5 kg)   BMI 35.78 kg/m      09/26/2022    8:47 AM 06/20/2022    9:00 AM 01/08/2022    7:31 AM  BP/Weight  Systolic BP 124 128 132  Diastolic BP 78 86 80  Wt. (Lbs) 215 215 211  BMI 35.78 kg/m2 35.78 kg/m2 35.11 kg/m2    Physical Exam Vitals reviewed.  Constitutional:      Appearance: Normal appearance. She is obese.  Neck:     Vascular: No carotid bruit.  Cardiovascular:     Rate and Rhythm: Normal rate and regular rhythm.     Heart sounds: Normal heart sounds.  Pulmonary:     Effort: Pulmonary effort is normal. No respiratory distress.     Breath sounds: Normal breath sounds.  Abdominal:     General: Abdomen is flat. Bowel sounds are normal.     Palpations: Abdomen is soft.     Tenderness: There is no abdominal tenderness.  Neurological:     Mental Status: She is alert and oriented to person, place, and time.  Psychiatric:        Mood and Affect: Mood normal.        Behavior: Behavior normal.     Diabetic Foot Exam - Simple   No data filed      Lab Results  Component Value Date   WBC 4.5 06/20/2022   HGB 16.7 (H) 06/20/2022   HCT 50.5 (H) 06/20/2022   PLT 250 06/20/2022   GLUCOSE 103 (H) 06/20/2022   CHOL 265 (H) 06/20/2022   TRIG 105 06/20/2022   HDL 50 06/20/2022   LDLCALC 196 (H) 06/20/2022   ALT 24 06/20/2022   AST 23 06/20/2022   NA 140 06/20/2022   K 4.6 06/20/2022   CL 104 06/20/2022   CREATININE 0.73 06/20/2022   BUN 13 06/20/2022   CO2  21 06/20/2022   TSH 1.950 06/20/2022   HGBA1C 5.1 06/20/2022      Assessment & Plan:   Problem List Items Addressed This Visit       Cardiovascular and Mediastinum   Essential hypertension    Well controlled on no medicines.  Recommend continue to  work on eating healthy diet and exercise.         Endocrine   Postablative hypothyroidism    Previously well controlled Continue Synthroid at current dose         Musculoskeletal and Integument   Osteopenia of multiple sites    Order vitamin D.       Relevant Orders   VITAMIN D 25 Hydroxy (Vit-D Deficiency, Fractures)     Other   Mixed hyperlipidemia - Primary    Await labs/testing for assessment and recommendations. Continue to work on eating a healthy diet and exercise.  Labs drawn today.        Relevant Orders   CBC with Differential/Platelet   Comprehensive metabolic panel   Lipid panel   Lipoprotein A (LPA)   Colon cancer screening   Relevant Orders   Cologuard  .  No orders of the defined types were placed in this encounter.   Orders Placed This Encounter  Procedures   Cologuard   CBC with Differential/Platelet   Comprehensive metabolic panel   Lipid panel   Lipoprotein A (LPA)   VITAMIN D 25 Hydroxy (Vit-D Deficiency, Fractures)     Follow-up: Return in about 3 months (around 12/26/2022) for chronic fasting.  An After Visit Summary was printed and given to the patient.  Blane Ohara, MD [facility] [phone]

## 2022-09-26 NOTE — Assessment & Plan Note (Signed)
Await labs/testing for assessment and recommendations. Continue to work on eating a healthy diet and exercise.  Labs drawn today.   

## 2022-09-27 LAB — COMPREHENSIVE METABOLIC PANEL
ALT: 21 IU/L (ref 0–32)
AST: 23 IU/L (ref 0–40)
Albumin/Globulin Ratio: 1.9 (ref 1.2–2.2)
Albumin: 4.8 g/dL (ref 3.9–4.9)
Alkaline Phosphatase: 58 IU/L (ref 44–121)
BUN/Creatinine Ratio: 16 (ref 12–28)
BUN: 14 mg/dL (ref 8–27)
Bilirubin Total: 0.9 mg/dL (ref 0.0–1.2)
CO2: 21 mmol/L (ref 20–29)
Calcium: 9.4 mg/dL (ref 8.7–10.3)
Chloride: 103 mmol/L (ref 96–106)
Creatinine, Ser: 0.9 mg/dL (ref 0.57–1.00)
Globulin, Total: 2.5 g/dL (ref 1.5–4.5)
Glucose: 99 mg/dL (ref 70–99)
Potassium: 4.6 mmol/L (ref 3.5–5.2)
Sodium: 141 mmol/L (ref 134–144)
Total Protein: 7.3 g/dL (ref 6.0–8.5)
eGFR: 71 mL/min/{1.73_m2} (ref >59)

## 2022-09-27 LAB — CBC WITH DIFFERENTIAL/PLATELET
Basophils Absolute: 0 10*3/uL (ref 0.0–0.2)
Basos: 1 %
EOS (ABSOLUTE): 0.1 10*3/uL (ref 0.0–0.4)
Eos: 1 %
Hematocrit: 52.3 % — ABNORMAL HIGH (ref 34.0–46.6)
Hemoglobin: 17.1 g/dL — ABNORMAL HIGH (ref 11.1–15.9)
Immature Grans (Abs): 0 10*3/uL (ref 0.0–0.1)
Immature Granulocytes: 0 %
Lymphocytes Absolute: 1.4 10*3/uL (ref 0.7–3.1)
Lymphs: 31 %
MCH: 30.3 pg (ref 26.6–33.0)
MCHC: 32.7 g/dL (ref 31.5–35.7)
MCV: 93 fL (ref 79–97)
Monocytes Absolute: 0.4 10*3/uL (ref 0.1–0.9)
Monocytes: 9 %
Neutrophils Absolute: 2.5 10*3/uL (ref 1.4–7.0)
Neutrophils: 58 %
Platelets: 261 10*3/uL (ref 150–450)
RBC: 5.64 x10E6/uL — ABNORMAL HIGH (ref 3.77–5.28)
RDW: 12.9 % (ref 11.7–15.4)
WBC: 4.4 10*3/uL (ref 3.4–10.8)

## 2022-09-27 LAB — LIPID PANEL
Chol/HDL Ratio: 5 ratio — ABNORMAL HIGH (ref 0.0–4.4)
Cholesterol, Total: 266 mg/dL — ABNORMAL HIGH (ref 100–199)
HDL: 53 mg/dL (ref >39)
LDL Chol Calc (NIH): 186 mg/dL — ABNORMAL HIGH (ref 0–99)
Triglycerides: 147 mg/dL (ref 0–149)
VLDL Cholesterol Cal: 27 mg/dL (ref 5–40)

## 2022-09-27 LAB — CARDIOVASCULAR RISK ASSESSMENT

## 2022-09-27 LAB — VITAMIN D 25 HYDROXY (VIT D DEFICIENCY, FRACTURES): Vit D, 25-Hydroxy: 56.2 ng/mL (ref 30.0–100.0)

## 2022-09-27 LAB — LIPOPROTEIN A (LPA): Lipoprotein (a): 141.3 nmol/L — ABNORMAL HIGH (ref <75.0)

## 2022-09-27 NOTE — Progress Notes (Signed)
Blood count abnormal. HB elevated.  Liver function normal.  Kidney function normal.  Cholesterol: LDL slightly improved, but still very high. 186. Lipoprotein A very high. Concerning for CORONARY ARTERY DISEASE.  Strongly recommended crestor 10 mg before bed and coenzyme q10 once daily.  Refer to cardiology for CORONARY ARTERY DISEASE evaluation. Vitamin D normal.

## 2022-10-01 ENCOUNTER — Other Ambulatory Visit: Payer: Self-pay

## 2022-10-01 ENCOUNTER — Encounter: Payer: Self-pay | Admitting: Family Medicine

## 2022-10-01 MED ORDER — ROSUVASTATIN CALCIUM 10 MG PO TABS: 10.0000 mg | ORAL_TABLET | Freq: Every day | ORAL | 1 refills | Status: DC

## 2022-10-11 ENCOUNTER — Ambulatory Visit
Admission: RE | Admit: 2022-10-11 | Discharge: 2022-10-11 | Disposition: A | Discharge status: Home / Self Care | Payer: Medicare Other | Source: Ambulatory Visit | Attending: Family Medicine | Admitting: Family Medicine

## 2022-10-11 DIAGNOSIS — Z1231 Encounter for screening mammogram for malignant neoplasm of breast: Secondary | ICD-10-CM | POA: Diagnosis not present

## 2022-10-29 ENCOUNTER — Encounter: Payer: Self-pay | Admitting: Family Medicine

## 2022-10-30 ENCOUNTER — Other Ambulatory Visit: Payer: Self-pay

## 2022-10-30 DIAGNOSIS — Z1211 Encounter for screening for malignant neoplasm of colon: Secondary | ICD-10-CM

## 2022-10-30 DIAGNOSIS — I2510 Atherosclerotic heart disease of native coronary artery without angina pectoris: Secondary | ICD-10-CM

## 2022-11-14 ENCOUNTER — Other Ambulatory Visit: Payer: Self-pay

## 2022-11-14 DIAGNOSIS — Z1211 Encounter for screening for malignant neoplasm of colon: Secondary | ICD-10-CM | POA: Diagnosis not present

## 2022-11-15 ENCOUNTER — Encounter: Payer: Self-pay | Admitting: Cardiology

## 2022-11-15 ENCOUNTER — Ambulatory Visit: Payer: Medicare Other | Attending: Cardiology | Admitting: Cardiology

## 2022-11-15 VITALS — BP 146/100 | HR 59 | Ht 65.0 in | Wt 215.2 lb

## 2022-11-15 DIAGNOSIS — R0609 Other forms of dyspnea: Secondary | ICD-10-CM | POA: Diagnosis not present

## 2022-11-15 DIAGNOSIS — E782 Mixed hyperlipidemia: Secondary | ICD-10-CM

## 2022-11-15 DIAGNOSIS — E890 Postprocedural hypothyroidism: Secondary | ICD-10-CM

## 2022-11-15 DIAGNOSIS — I10 Essential (primary) hypertension: Secondary | ICD-10-CM | POA: Diagnosis not present

## 2022-11-15 NOTE — Progress Notes (Signed)
Cardiology Consultation:    Date:  11/15/2022   ID:  Bridget Jones, DOB 04-10-1956, MRN [id number]  PCP:  Rochel Brome, MD  Cardiologist:  Jenne Campus, MD   Referring MD: Rochel Brome, MD   Chief Complaint  Patient presents with   elevated  cholesterol    History of Present Illness:    Bridget Jones is a 67 y.o. female who is being seen today for the evaluation of dyslipidemia at the request of Cox, Kirsten, MD. past medical history significant for initially hyper thyroidism and then now hypothyroidism, essential hypertension, dyslipidemia.  She tried Lipitor previously however developed diarrhea after that now she is taking Crestor with good tolerance.  She was referred to [place] because of concern about her dyslipidemia as well as she is simply worried about coronary artery disease.  Likely she does not having any symptoms suggesting coronary artery disease.  She is trying to be active and walk on the regular basis but now does not do much because of poor weather.  When she is walking she goes usually for about 45 minutes there is some uphill she has to do and she has no difficulty doing this.  Denies have any chest pain tightness squeezing pressure burning chest.  She never smoked does have family history of vascular problem for example her father got strokes but he was older than 5.  There is no premature coronary artery disease in the family.  She is not on any special diet and she now does not exercise on the regular basis because of poor weather.  She never had any heart trouble, no myocardial infarction no congestive heart failure.  Past Medical History:  Diagnosis Date   Hyperlipidemia    Hypertension     History reviewed. No pertinent surgical history.  Current Medications: Current Meds  Medication Sig   calcium carbonate (OSCAL) 1500 (600 Ca) MG TABS tablet Take 1,500 mg by mouth 2 (two) times daily with a meal.   Cholecalciferol (D3 PO) Take 1 tablet by mouth daily.    Coenzyme Q10 (CO Q 10 PO) Take 1 tablet by mouth daily.   KRILL OIL PO Take 1 tablet by mouth daily.   Magnesium Citrate 200 MG TABS Take 1 tablet by mouth daily.   Multiple Vitamin (MULTIVITAMIN) tablet Take 1 tablet by mouth daily.   psyllium (REGULOID) 0.52 g capsule Take 0.52 g by mouth daily.   rosuvastatin (CRESTOR) 10 MG tablet Take 1 tablet (10 mg total) by mouth daily.   SYNTHROID 125 MCG tablet TAKE 1 TABLET (125 MCG TOTAL) DAILY BEFORE BREAKFAST (Patient taking differently: Take 125 mcg by mouth daily before breakfast.)   Turmeric (QC TUMERIC COMPLEX PO) Take 1 tablet by mouth 2 (two) times daily at 10 AM and 5 PM.     Allergies:   Atorvastatin   Social History   Socioeconomic History   Marital status: Married    Spouse name: Not on file   Number of children: 2   Years of education: Not on file   Highest education level: Not on file  Occupational History   Occupation: human resources    Comment: Retired  Tobacco Use   Smoking status: Never   Smokeless tobacco: Never  Substance and Sexual Activity   Alcohol use: Yes    Comment: rarely ( one drink per month)   Drug use: Never   Sexual activity: Yes  Other Topics Concern   Not on file  Social History Narrative  Not on file   Social Determinants of Health   Financial Resource Strain: Low Risk  (06/20/2022)   Overall Financial Resource Strain (CARDIA)    Difficulty of Paying Living Expenses: Not hard at all  Food Insecurity: No Food Insecurity (06/20/2022)   Hunger Vital Sign    Worried About Running Out of Food in the Last Year: Never true    Ran Out of Food in the Last Year: Never true  Transportation Needs: No Transportation Needs (06/20/2022)   PRAPARE - Hydrologist (Medical): No    Lack of Transportation (Non-Medical): No  Physical Activity: Sufficiently Active (06/20/2022)   Exercise Vital Sign    Days of Exercise per Week: 7 days    Minutes of Exercise per Session: 30 min   Stress: No Stress Concern Present (06/20/2022)   [place]    Feeling of Stress : Not at all  Social Connections: [place] (06/20/2022)   Social Connection and Isolation Panel [NHANES]    Frequency of Communication with Friends and Family: More than three times a week    Frequency of Social Gatherings with Friends and Family: Twice a week    Attends Religious Services: More than 4 times per year    Active Member of [employer] or Organizations: Yes    Attends Archivist Meetings: 1 to 4 times per year    Marital Status: Married     Family History: The patient's family history includes Diabetes in her father; Hypertension in her mother; Hyperthyroidism in her sister; Hypothyroidism in her sister; Stroke in her father. There is no history of Breast cancer. ROS:   Please see the history of present illness.    All 14 point review of systems negative except as described per history of present illness.  EKGs/Labs/Other Studies Reviewed:    The following studies were reviewed today:   EKG:  EKG is  ordered today.  The ekg ordered today demonstrates normal sinus rhythm, normal P interval, normal QS complex duration morphology, no ST segment changes.  Recent Labs: 06/20/2022: TSH 1.950 09/26/2022: ALT 21; BUN 14; Creatinine, Ser 0.90; Hemoglobin 17.1; Platelets 261; Potassium 4.6; Sodium 141  Recent Lipid Panel    Component Value Date/Time   CHOL 266 (H) 09/26/2022 1010   TRIG 147 09/26/2022 1010   HDL 53 09/26/2022 1010   CHOLHDL 5.0 (H) 09/26/2022 1010   LDLCALC 186 (H) 09/26/2022 1010    Physical Exam:    VS:  BP (!) 146/100 (BP Location: Left Arm, Patient Position: Sitting)   Pulse (!) 59   Ht 5\' 5"  (1.651 m)   Wt 215 lb 3.2 oz (97.6 kg)   SpO2 96%   BMI 35.81 kg/m     Wt Readings from Last 3 Encounters:  11/15/22 215 lb 3.2 oz (97.6 kg)  09/26/22 215 lb (97.5 kg)  06/20/22 215 lb (97.5 kg)      GEN:  Well nourished, well developed in no acute distress HEENT: Normal NECK: No JVD; No carotid bruits LYMPHATICS: No lymphadenopathy CARDIAC: RRR, no murmurs, no rubs, no gallops RESPIRATORY:  Clear to auscultation without rales, wheezing or rhonchi  ABDOMEN: Soft, non-tender, non-distended MUSCULOSKELETAL:  No edema; No deformity  SKIN: Warm and dry NEUROLOGIC:  Alert and oriented x 3 PSYCHIATRIC:  Normal affect   ASSESSMENT:    1. Essential hypertension   2. Mixed hyperlipidemia   3. Dyspnea on exertion   4. Postablative  hypothyroidism    PLAN:    In order of problems listed above:  Mixed dyslipidemia clearly need to be controlled.  I did review K PN which show me LDL 186 HDL 53 on top of that she does have elevated LP(a).  She has been put on Crestor which is very appropriate.  She is only 10 mg which is moderate med sent.  Will wait for results of her fasting lipid profile to see if we need to augment therapy.  She is concerned about potentially coronary artery disease, she does have any symptoms suggestive active coronary artery disease however I think there is some value in calcium score based on calcium score we need to decide how aggressive we need to be with her medical therapy for dyslipidemia as well as if she will have very high calcium score we may even push toward stress testing to make sure she does not have an obstructive disease even though she does not have any symptomatology that was suggested. Essential hypertension this is first visit in my office slightly elevated blood pressure.  Will do echocardiogram to assess left ventricle ejection fraction and look for left ventricular hypertrophy. Dyspnea on exertion.  Echocardiogram will be done.   Medication Adjustments/Labs and Tests Ordered: Current medicines are reviewed at length with the patient today.  Concerns regarding medicines are outlined above.  No orders of the defined types were placed in this  encounter.  No orders of the defined types were placed in this encounter.   Signed, Park Liter, MD, [facility]. 11/15/2022 9:23 AM    [place]

## 2022-11-15 NOTE — Patient Instructions (Signed)
Medication Instructions:  Your physician recommends that you continue on your current medications as directed. Please refer to the Current Medication list given to you today.  *If you need a refill on your cardiac medications before your next appointment, please call your pharmacy*   Lab Work: None Ordered If you have labs (blood work) drawn today and your tests are completely normal, you will receive your results only by: MyChart Message (if you have MyChart) OR A paper copy in the mail If you have any lab test that is abnormal or we need to change your treatment, we will call you to review the results.   Testing/Procedures: We will order CT coronary calcium score. It will cost $99.00 and is not covered by insurance.  Please call to schedule.     [facility] [place] [address] [place], [place] [address] [phone]    Your physician has requested that you have an echocardiogram. Echocardiography is a painless test that uses sound waves to create images of your heart. It provides your doctor with information about the size and shape of your heart and how well your heart's chambers and valves are working. This procedure takes approximately one hour. There are no restrictions for this procedure. Please do NOT wear cologne, perfume, aftershave, or lotions (deodorant is allowed). Please arrive 15 minutes prior to your appointment time.    Follow-Up: At [facility], you and your health needs are our priority.  As part of our continuing mission to provide you with exceptional heart care, we have created designated Provider Care Teams.  These Care Teams include your primary Cardiologist (physician) and Advanced Practice Providers (APPs -  Physician Assistants and Nurse Practitioners) who all work together to provide you with the care you need, when you need it.  We recommend signing up for the patient portal called "MyChart".  Sign up information is provided on this After Visit  Summary.  MyChart is used to connect with patients for Virtual Visits (Telemedicine).  Patients are able to view lab/test results, encounter notes, upcoming appointments, etc.  Non-urgent messages can be sent to your provider as well.   To learn more about what you can do with MyChart, go to [email or web address].    Your next appointment:   3 month(s)  The format for your next appointment:   In Person  Provider:   Robert Krasowski, MD    Other Instructions NA  

## 2022-11-15 NOTE — Addendum Note (Signed)
Addended by: Jacobo Forest D on: 11/15/2022 09:29 AM   Modules accepted: Orders

## 2022-11-17 ENCOUNTER — Telehealth (HOSPITAL_BASED_OUTPATIENT_CLINIC_OR_DEPARTMENT_OTHER): Payer: Self-pay

## 2022-11-22 ENCOUNTER — Ambulatory Visit: Payer: Medicare Other | Attending: Cardiology

## 2022-11-22 DIAGNOSIS — I5030 Unspecified diastolic (congestive) heart failure: Secondary | ICD-10-CM

## 2022-11-22 DIAGNOSIS — R0609 Other forms of dyspnea: Secondary | ICD-10-CM

## 2022-11-22 DIAGNOSIS — I77810 Thoracic aortic ectasia: Secondary | ICD-10-CM | POA: Diagnosis not present

## 2022-11-22 LAB — ECHOCARDIOGRAM COMPLETE
Area-P 1/2: 3.08 cm2
S' Lateral: 2.7 cm

## 2022-11-22 LAB — COLOGUARD: COLOGUARD: NEGATIVE

## 2022-11-22 NOTE — Progress Notes (Signed)
Exam observed by Kalie Lambeth. 

## 2022-11-23 ENCOUNTER — Telehealth: Payer: Medicare Other | Admitting: Physician Assistant

## 2022-11-23 ENCOUNTER — Telehealth: Payer: Self-pay

## 2022-11-23 DIAGNOSIS — J019 Acute sinusitis, unspecified: Secondary | ICD-10-CM

## 2022-11-23 DIAGNOSIS — B9689 Other specified bacterial agents as the cause of diseases classified elsewhere: Secondary | ICD-10-CM | POA: Diagnosis not present

## 2022-11-23 MED ORDER — AMOXICILLIN-POT CLAVULANATE 875-125 MG PO TABS: 1.0000 | ORAL_TABLET | Freq: Two times a day (BID) | ORAL | 0 refills | Status: DC

## 2022-11-23 NOTE — Telephone Encounter (Signed)
Results reviewed with pt as per Dr. Krasowski's note.  Pt verbalized understanding and had no additional questions. Routed to PCP  

## 2022-11-23 NOTE — Patient Instructions (Signed)
Derl Barrow, thank you for joining Mar Daring, PA-C for today's virtual visit.  While this provider is not your primary care provider (PCP), if your PCP is located in our provider database this encounter information will be shared with them immediately following your visit.   [place] account gives you access to today's visit and all your visits, tests, and labs performed at [facility] " click here if you don't have a [place] account or go to [email or web address]  Consent: (Patient) Bridget Jones provided verbal consent for this virtual visit at the beginning of the encounter.  Current Medications:  Current Outpatient Medications:    amoxicillin-clavulanate (AUGMENTIN) 875-125 MG tablet, Take 1 tablet by mouth 2 (two) times daily., Disp: 20 tablet, Rfl: 0   calcium carbonate (OSCAL) 1500 (600 Ca) MG TABS tablet, Take 1,500 mg by mouth 2 (two) times daily with a meal., Disp: , Rfl:    Cholecalciferol (D3 PO), Take 1 tablet by mouth daily., Disp: , Rfl:    Coenzyme Q10 (CO Q 10 PO), Take 1 tablet by mouth daily., Disp: , Rfl:    KRILL OIL PO, Take 1 tablet by mouth daily., Disp: , Rfl:    Magnesium Citrate 200 MG TABS, Take 1 tablet by mouth daily., Disp: , Rfl:    Multiple Vitamin (MULTIVITAMIN) tablet, Take 1 tablet by mouth daily., Disp: , Rfl:    psyllium (REGULOID) 0.52 g capsule, Take 0.52 g by mouth daily., Disp: , Rfl:    rosuvastatin (CRESTOR) 10 MG tablet, Take 1 tablet (10 mg total) by mouth daily., Disp: 90 tablet, Rfl: 1   SYNTHROID 125 MCG tablet, TAKE 1 TABLET (125 MCG TOTAL) DAILY BEFORE BREAKFAST (Patient taking differently: Take 125 mcg by mouth daily before breakfast.), Disp: 90 tablet, Rfl: 3   Turmeric (QC TUMERIC COMPLEX PO), Take 1 tablet by mouth 2 (two) times daily at 10 AM and 5 PM., Disp: , Rfl:    Medications ordered in this encounter:  Meds ordered this encounter  Medications   amoxicillin-clavulanate (AUGMENTIN)  875-125 MG tablet    Sig: Take 1 tablet by mouth 2 (two) times daily.    Dispense:  20 tablet    Refill:  0    Order Specific Question:   Supervising Provider    Answer:   Chase Picket [id number]     *If you need refills on other medications prior to your next appointment, please contact your pharmacy*  Follow-Up: Call back or seek an in-person evaluation if the symptoms worsen or if the condition fails to improve as anticipated.  [place] [phone]  Other Instructions  Sinus Infection, Adult A sinus infection, also called sinusitis, is inflammation of your sinuses. Sinuses are hollow spaces in the bones around your face. Your sinuses are located: Around your eyes. In the middle of your forehead. Behind your nose. In your cheekbones. Mucus normally drains out of your sinuses. When your nasal tissues become inflamed or swollen, mucus can become trapped or blocked. This allows bacteria, viruses, and fungi to grow, which leads to infection. Most infections of the sinuses are caused by a virus. A sinus infection can develop quickly. It can last for up to 4 weeks (acute) or for more than 12 weeks (chronic). A sinus infection often develops after a cold. What are the causes? This condition is caused by anything that creates swelling in the sinuses or stops mucus from draining. This includes: Allergies. Asthma.  Infection from bacteria or viruses. Deformities or blockages in your nose or sinuses. Abnormal growths in the nose (nasal polyps). Pollutants, such as chemicals or irritants in the air. Infection from fungi. This is rare. What increases the risk? You are more likely to develop this condition if you: Have a weak body defense system (immune system). Do a lot of swimming or diving. Overuse nasal sprays. Smoke. What are the signs or symptoms? The main symptoms of this condition are pain and a feeling of pressure around the affected sinuses. Other symptoms  include: Stuffy nose or congestion that makes it difficult to breathe through your nose. Thick yellow or greenish drainage from your nose. Tenderness, swelling, and warmth over the affected sinuses. A cough that may get worse at night. Decreased sense of smell and taste. Extra mucus that collects in the throat or the back of the nose (postnasal drip) causing a sore throat or bad breath. Tiredness (fatigue). Fever. How is this diagnosed? This condition is diagnosed based on: Your symptoms. Your medical history. A physical exam. Tests to find out if your condition is acute or chronic. This may include: Checking your nose for nasal polyps. Viewing your sinuses using a device that has a light (endoscope). Testing for allergies or bacteria. Imaging tests, such as an MRI or CT scan. In rare cases, a bone biopsy may be done to rule out more serious types of fungal sinus disease. How is this treated? Treatment for a sinus infection depends on the cause and whether your condition is chronic or acute. If caused by a virus, your symptoms should go away on their own within 10 days. You may be given medicines to relieve symptoms. They include: Medicines that shrink swollen nasal passages (decongestants). A spray that eases inflammation of the nostrils (topical intranasal corticosteroids). Rinses that help get rid of thick mucus in your nose (nasal saline washes). Medicines that treat allergies (antihistamines). Over-the-counter pain relievers. If caused by bacteria, your health care provider may recommend waiting to see if your symptoms improve. Most bacterial infections will get better without antibiotic medicine. You may be given antibiotics if you have: A severe infection. A weak immune system. If caused by narrow nasal passages or nasal polyps, surgery may be needed. Follow these instructions at home: Medicines Take, use, or apply over-the-counter and prescription medicines only as told by  your health care provider. These may include nasal sprays. If you were prescribed an antibiotic medicine, take it as told by your health care provider. Do not stop taking the antibiotic even if you start to feel better. Hydrate and humidify  Drink enough fluid to keep your urine pale yellow. Staying hydrated will help to thin your mucus. Use a cool mist humidifier to keep the humidity level in your home above 50%. Inhale steam for 10-15 minutes, 3-4 times a day, or as told by your health care provider. You can do this in the bathroom while a hot shower is running. Limit your exposure to cool or dry air. Rest Rest as much as possible. Sleep with your head raised (elevated). Make sure you get enough sleep each night. General instructions  Apply a warm, moist washcloth to your face 3-4 times a day or as told by your health care provider. This will help with discomfort. Use nasal saline washes as often as told by your health care provider. Wash your hands often with soap and water to reduce your exposure to germs. If soap and water are not available, use  hand sanitizer. Do not smoke. Avoid being around people who are smoking (secondhand smoke). Keep all follow-up visits. This is important. Contact a health care provider if: You have a fever. Your symptoms get worse. Your symptoms do not improve within 10 days. Get help right away if: You have a severe headache. You have persistent vomiting. You have severe pain or swelling around your face or eyes. You have vision problems. You develop confusion. Your neck is stiff. You have trouble breathing. These symptoms may be an emergency. Get help right away. Call 911. Do not wait to see if the symptoms will go away. Do not drive yourself to the hospital. Summary A sinus infection is soreness and inflammation of your sinuses. Sinuses are hollow spaces in the bones around your face. This condition is caused by nasal tissues that become inflamed  or swollen. The swelling traps or blocks the flow of mucus. This allows bacteria, viruses, and fungi to grow, which leads to infection. If you were prescribed an antibiotic medicine, take it as told by your health care provider. Do not stop taking the antibiotic even if you start to feel better. Keep all follow-up visits. This is important. This information is not intended to replace advice given to you by your health care provider. Make sure you discuss any questions you have with your health care provider. Document Revised: 09/05/2021 Document Reviewed: 09/05/2021 Elsevier Patient Education  [place].    If you have been instructed to have an in-person evaluation today at a local Urgent Care facility, please use the link below. It will take you to a list of all of our available [place] [facility], including address, phone number and hours of operation. Please do not delay care.  [facility]  If you or a family member do not have a primary care provider, use the link below to schedule a visit and establish care. When you choose a [facility] primary care physician or advanced practice provider, you gain a long-term partner in health. Find a Primary Care Provider  Learn more about [facility]'s in-office and virtual care options: [facility]

## 2022-11-23 NOTE — Progress Notes (Signed)
Virtual Visit Consent   Bridget Jones, you are scheduled for a virtual visit with a [facility] provider today. Just as with appointments in the office, your consent must be obtained to participate. Your consent will be active for this visit and any virtual visit you may have with one of our providers in the next 365 days. If you have a MyChart account, a copy of this consent can be sent to you electronically.  As this is a virtual visit, video technology does not allow for your provider to perform a traditional examination. This may limit your provider's ability to fully assess your condition. If your provider identifies any concerns that need to be evaluated in person or the need to arrange testing (such as labs, EKG, etc.), we will make arrangements to do so. Although advances in technology are sophisticated, we cannot ensure that it will always work on either your end or our end. If the connection with a video visit is poor, the visit may have to be switched to a telephone visit. With either a video or telephone visit, we are not always able to ensure that we have a secure connection.  By engaging in this virtual visit, you consent to the provision of healthcare and authorize for your insurance to be billed (if applicable) for the services provided during this visit. Depending on your insurance coverage, you may receive a charge related to this service.  I need to obtain your verbal consent now. Are you willing to proceed with your visit today? Bridget Jones has provided verbal consent on 11/23/2022 for a virtual visit (video or telephone). Mar Daring, PA-C  Date: 11/23/2022 10:48 AM  Virtual Visit via Video Note   I, Mar Daring, connected with  Bridget Jones  ([id number], 1956/09/26) on 11/23/22 at 10:45 AM EST by a video-enabled telemedicine application and verified that I am speaking with the correct person using two identifiers.  Location: Patient: Virtual Visit Location Patient:  Home Provider: Virtual Visit Location Provider: Home Office   I discussed the limitations of evaluation and management by telemedicine and the availability of in person appointments. The patient expressed understanding and agreed to proceed.    History of Present Illness: Bridget Jones is a 67 y.o. who identifies as a female who was assigned female at birth, and is being seen today for possible sinus infection.  HPI: Sinusitis This is a new problem. The current episode started in the past 7 days. The problem has been gradually worsening since onset. There has been no fever. The pain is moderate. Associated symptoms include congestion, coughing (very little), ear pain (right starting to ache today intermittently), headaches, sinus pressure and sneezing. Pertinent negatives include no chills, hoarse voice, shortness of breath or sore throat. (Rhinorrhea, tooth pain from pressure, mild post nasal drainage) Treatments tried: Mucinex sinus max. The treatment provided no relief.     Problems:  Patient Active Problem List   Diagnosis Date Noted   Dyspnea on exertion 11/15/2022   Osteopenia of multiple sites 09/26/2022   Colon cancer screening 09/26/2022   Need for pneumococcal 20-valent conjugate vaccination 06/20/2022   Encounter for routine adult medical exam with abnormal findings 06/20/2022   Screen for colon cancer 06/20/2022   Class 2 severe obesity with body mass index (BMI) of 35 to 39.9 with serious comorbidity ([place]) 06/20/2022   Osteoporosis screening 06/20/2022   Encounter for screening for HIV 06/20/2022   Need for hepatitis C screening test 06/20/2022  Cervical cancer screening 06/20/2022   Class 2 severe obesity due to excess calories with serious comorbidity and body mass index (BMI) of 35.0 to 35.9 in adult [facility]) 07/10/2021   Mixed hyperlipidemia 07/10/2021   Essential hypertension 07/10/2021   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   GRAVE'S DISEASE 10/27/2007    Allergies:   Allergies  Allergen Reactions   Atorvastatin     diarrhea   Medications:  Current Outpatient Medications:    amoxicillin-clavulanate (AUGMENTIN) 875-125 MG tablet, Take 1 tablet by mouth 2 (two) times daily., Disp: 20 tablet, Rfl: 0   calcium carbonate (OSCAL) 1500 (600 Ca) MG TABS tablet, Take 1,500 mg by mouth 2 (two) times daily with a meal., Disp: , Rfl:    Cholecalciferol (D3 PO), Take 1 tablet by mouth daily., Disp: , Rfl:    Coenzyme Q10 (CO Q 10 PO), Take 1 tablet by mouth daily., Disp: , Rfl:    KRILL OIL PO, Take 1 tablet by mouth daily., Disp: , Rfl:    Magnesium Citrate 200 MG TABS, Take 1 tablet by mouth daily., Disp: , Rfl:    Multiple Vitamin (MULTIVITAMIN) tablet, Take 1 tablet by mouth daily., Disp: , Rfl:    psyllium (REGULOID) 0.52 g capsule, Take 0.52 g by mouth daily., Disp: , Rfl:    rosuvastatin (CRESTOR) 10 MG tablet, Take 1 tablet (10 mg total) by mouth daily., Disp: 90 tablet, Rfl: 1   SYNTHROID 125 MCG tablet, TAKE 1 TABLET (125 MCG TOTAL) DAILY BEFORE BREAKFAST (Patient taking differently: Take 125 mcg by mouth daily before breakfast.), Disp: 90 tablet, Rfl: 3   Turmeric (QC TUMERIC COMPLEX PO), Take 1 tablet by mouth 2 (two) times daily at 10 AM and 5 PM., Disp: , Rfl:   Observations/Objective: Patient is well-developed, well-nourished in no acute distress.  Resting comfortably at home.  Head is normocephalic, atraumatic.  No labored breathing.  Speech is clear and coherent with logical content.  Patient is alert and oriented at baseline.    Assessment and Plan: 1. Acute bacterial sinusitis - amoxicillin-clavulanate (AUGMENTIN) 875-125 MG tablet; Take 1 tablet by mouth 2 (two) times daily.  Dispense: 20 tablet; Refill: 0  - Worsening symptoms that have not responded to OTC medications.  - Will give Augmentin - Continue allergy medications.  - Steam and humidifier can help - Stay well hydrated and get plenty of rest.  - Seek in person evaluation if  no symptom improvement or if symptoms worsen   Follow Up Instructions: I discussed the assessment and treatment plan with the patient. The patient was provided an opportunity to ask questions and all were answered. The patient agreed with the plan and demonstrated an understanding of the instructions.  A copy of instructions were sent to the patient via MyChart unless otherwise noted below.    The patient was advised to call back or seek an in-person evaluation if the symptoms worsen or if the condition fails to improve as anticipated.  Time:  I spent 10 minutes with the patient via telehealth technology discussing the above problems/concerns.    Mar Daring, PA-C

## 2022-11-27 ENCOUNTER — Other Ambulatory Visit (HOSPITAL_BASED_OUTPATIENT_CLINIC_OR_DEPARTMENT_OTHER): Payer: Medicare Other

## 2022-12-03 ENCOUNTER — Ambulatory Visit (HOSPITAL_BASED_OUTPATIENT_CLINIC_OR_DEPARTMENT_OTHER)
Admission: RE | Admit: 2022-12-03 | Discharge: 2022-12-03 | Disposition: A | Discharge status: Home / Self Care | Payer: Medicare Other | Source: Ambulatory Visit | Attending: Cardiology | Admitting: Cardiology

## 2022-12-03 DIAGNOSIS — E782 Mixed hyperlipidemia: Secondary | ICD-10-CM

## 2022-12-03 DIAGNOSIS — R0609 Other forms of dyspnea: Secondary | ICD-10-CM | POA: Insufficient documentation

## 2022-12-03 DIAGNOSIS — I10 Essential (primary) hypertension: Secondary | ICD-10-CM | POA: Insufficient documentation

## 2022-12-11 ENCOUNTER — Telehealth: Payer: Self-pay

## 2022-12-11 NOTE — Telephone Encounter (Signed)
Results reviewed with pt as per Dr. Krasowski's note.  Pt verbalized understanding and had no additional questions. Routed to PCP  

## 2022-12-17 ENCOUNTER — Telehealth: Payer: Self-pay

## 2022-12-17 NOTE — Telephone Encounter (Signed)
Spoke with pt regarding CT overread. Advised per Dr. Wendy Poet note to follow up with PCP regarding finding in the lungs. Routed to PCP. Pt agreed to follow up, she stated that she has an upcoming appt and will discuss it then.

## 2022-12-20 ENCOUNTER — Encounter: Payer: Self-pay | Admitting: Family Medicine

## 2022-12-20 ENCOUNTER — Ambulatory Visit (INDEPENDENT_AMBULATORY_CARE_PROVIDER_SITE_OTHER): Payer: Medicare Other | Admitting: Family Medicine

## 2022-12-20 VITALS — BP 134/78 | HR 73 | Temp 97.2°F | Ht 65.0 in | Wt 217.0 lb

## 2022-12-20 DIAGNOSIS — R0609 Other forms of dyspnea: Secondary | ICD-10-CM | POA: Diagnosis not present

## 2022-12-20 DIAGNOSIS — E782 Mixed hyperlipidemia: Secondary | ICD-10-CM

## 2022-12-20 DIAGNOSIS — R9389 Abnormal findings on diagnostic imaging of other specified body structures: Secondary | ICD-10-CM | POA: Diagnosis not present

## 2022-12-20 DIAGNOSIS — Z6836 Body mass index (BMI) 36.0-36.9, adult: Secondary | ICD-10-CM

## 2022-12-20 DIAGNOSIS — E890 Postprocedural hypothyroidism: Secondary | ICD-10-CM | POA: Diagnosis not present

## 2022-12-20 NOTE — Assessment & Plan Note (Signed)
Recommend continue to work on eating healthy diet and exercise. Comorbidities: hyperlipidemia. hypothyroidism

## 2022-12-20 NOTE — Assessment & Plan Note (Signed)
Refer to pulmonology.  Peak flows good.  No evidence of pulmonary edema on exam.

## 2022-12-20 NOTE — Assessment & Plan Note (Signed)
Refer to pulmonology.

## 2022-12-20 NOTE — Assessment & Plan Note (Signed)
At [facility]

## 2022-12-20 NOTE — Assessment & Plan Note (Signed)
Continue crestor, may need to adjust dose.  Recommend continue to work on eating healthy diet and exercise. Return for labs in 2-3 weeks.

## 2022-12-20 NOTE — Progress Notes (Signed)
Subjective:  Patient ID: Bridget Jones, female    DOB: [birth year]  Age: 67 y.o. MRN: [id number]  Chief Complaint  Patient presents with   cardiology Follow Up    HPI Patient states she had a CTA of the heart and states Cardiology informed her the Radiologist found a spot on her lung and to follow up with her PCP. Does get shob with exertion.  CTA showed groundglass appearance with possible pneumonitis versus pulmonary edema.  Patient is essentially asymptomatic.  She does get some shortness of breath with significant climb up a hill.  She walks daily without issue.  She rides an e-bike around her neighborhood without issue.  No history of smoking.  Patient has a history of hyperlipidemia with LDL above 180.  She started on Crestor 10 mg daily and takes coenzyme Q 10.  She is due to have her cholesterol checked in 2 weeks.  She is tolerating the medicine okay.  Acquired hypothyroidism: Currently on Synthroid 125 mcg daily.    12/20/2022   10:47 AM 06/20/2022    9:04 AM 09/19/2021    8:59 AM 06/21/2020   12:17 AM  Depression screen PHQ 2/9  Decreased Interest 0 0 0 0  Down, Depressed, Hopeless 0 0 0 0  PHQ - 2 Score 0 0 0 0         09/19/2021    8:59 AM 06/20/2022    9:18 AM 12/20/2022   10:47 AM  Fall Risk  Falls in the past year? 0 0 0  Was there an injury with Fall? 0 0 0  Fall Risk Category Calculator 0 0 0  Fall Risk Category (Retired) Low Low   (RETIRED) Patient Fall Risk Level Low fall risk Low fall risk   Patient at Risk for Falls Due to No Fall Risks No Fall Risks No Fall Risks  Fall risk Follow up Falls evaluation completed Falls evaluation completed Falls evaluation completed      Review of Systems  Constitutional:  Negative for chills, fatigue and fever.  HENT:  Negative for congestion, ear pain, rhinorrhea and sore throat.   Respiratory:  Negative for cough and shortness of breath.   Cardiovascular:  Negative for chest pain.  Gastrointestinal:  Negative for abdominal  pain, constipation, diarrhea, nausea and vomiting.  Genitourinary:  Negative for dysuria and urgency.  Musculoskeletal:  Negative for back pain and myalgias.  Neurological:  Negative for dizziness, weakness, light-headedness and headaches.  Psychiatric/Behavioral:  Negative for dysphoric mood. The patient is not nervous/anxious.     Current Outpatient Medications on File Prior to Visit  Medication Sig Dispense Refill   calcium carbonate (OSCAL) 1500 (600 Ca) MG TABS tablet Take 1,500 mg by mouth 2 (two) times daily with a meal.     Cholecalciferol (D3 PO) Take 1 tablet by mouth daily.     Coenzyme Q10 (CO Q 10 PO) Take 1 tablet by mouth daily.     KRILL OIL PO Take 1 tablet by mouth daily.     Magnesium Citrate 200 MG TABS Take 1 tablet by mouth daily.     Multiple Vitamin (MULTIVITAMIN) tablet Take 1 tablet by mouth daily.     psyllium (REGULOID) 0.52 g capsule Take 0.52 g by mouth daily.     rosuvastatin (CRESTOR) 10 MG tablet Take 1 tablet (10 mg total) by mouth daily. 90 tablet 1   SYNTHROID 125 MCG tablet TAKE 1 TABLET (125 MCG TOTAL) DAILY BEFORE BREAKFAST (Patient taking differently: Take  125 mcg by mouth daily before breakfast.) 90 tablet 3   Turmeric (QC TUMERIC COMPLEX PO) Take 1 tablet by mouth 2 (two) times daily at 10 AM and 5 PM.     No current facility-administered medications on file prior to visit.   Past Medical History:  Diagnosis Date   Hyperlipidemia    Hypertension    History reviewed. No pertinent surgical history.  Family History  Problem Relation Age of Onset   Hypertension Mother    Diabetes Father    Stroke Father    Hyperthyroidism Sister    Hypothyroidism Sister    Breast cancer Neg Hx    Social History   Socioeconomic History   Marital status: Married    Spouse name: Not on file   Number of children: 2   Years of education: Not on file   Highest education level: Not on file  Occupational History   Occupation: human resources    Comment:  Retired  Tobacco Use   Smoking status: Never   Smokeless tobacco: Never  Substance and Sexual Activity   Alcohol use: Yes    Comment: rarely ( one drink per month)   Drug use: Never   Sexual activity: Yes  Other Topics Concern   Not on file  Social History Narrative   Not on file   Social Determinants of Health   Financial Resource Strain: Low Risk  (06/20/2022)   Overall Financial Resource Strain (CARDIA)    Difficulty of Paying Living Expenses: Not hard at all  Food Insecurity: No Food Insecurity (06/20/2022)   Hunger Vital Sign    Worried About Running Out of Food in the Last Year: Never true    [place] in the Last Year: Never true  Transportation Needs: No Transportation Needs (06/20/2022)   PRAPARE - Hydrologist (Medical): No    Lack of Transportation (Non-Medical): No  Physical Activity: Sufficiently Active (06/20/2022)   Exercise Vital Sign    Days of Exercise per Week: 7 days    Minutes of Exercise per Session: 30 min  Stress: No Stress Concern Present (06/20/2022)   [place]    Feeling of Stress : Not at all  Social Connections: [place] (06/20/2022)   Social Connection and Isolation Panel [NHANES]    Frequency of Communication with Friends and Family: More than three times a week    Frequency of Social Gatherings with Friends and Family: Twice a week    Attends Religious Services: More than 4 times per year    Active Member of [employer] or Organizations: Yes    Attends Archivist Meetings: 1 to 4 times per year    Marital Status: Married    Objective:  BP 134/78   Pulse 73   Temp (!) 97.2 F (36.2 C)   Ht '5\' 5"'$  (1.651 m)   Wt 217 lb (98.4 kg)   SpO2 99%   BMI 36.11 kg/m      12/20/2022   10:45 AM 11/15/2022    8:57 AM 09/26/2022    8:47 AM  BP/Weight  Systolic BP [id number]  Diastolic BP 78 123XX123 78  Wt. (Lbs) 217 215.2 215  BMI 36.11 kg/m2  35.81 kg/m2 35.78 kg/m2    Physical Exam Vitals reviewed.  Constitutional:      Appearance: Normal appearance. She is obese.  Neck:     Vascular: No carotid bruit.  Cardiovascular:  Rate and Rhythm: Normal rate and regular rhythm.     Heart sounds: Normal heart sounds.  Pulmonary:     Effort: Pulmonary effort is normal. No respiratory distress.     Breath sounds: Normal breath sounds.  Abdominal:     General: Abdomen is flat. Bowel sounds are normal.     Palpations: Abdomen is soft.     Tenderness: There is no abdominal tenderness.  Musculoskeletal:     Right lower leg: No edema.     Left lower leg: No edema.  Neurological:     Mental Status: She is alert and oriented to person, place, and time.  Psychiatric:        Mood and Affect: Mood normal.        Behavior: Behavior normal.     Diabetic Foot Exam - Simple   No data filed      Lab Results  Component Value Date   WBC 4.4 09/26/2022   HGB 17.1 (H) 09/26/2022   HCT 52.3 (H) 09/26/2022   PLT 261 09/26/2022   GLUCOSE 99 09/26/2022   CHOL 266 (H) 09/26/2022   TRIG 147 09/26/2022   HDL 53 09/26/2022   LDLCALC 186 (H) 09/26/2022   ALT 21 09/26/2022   AST 23 09/26/2022   NA 141 09/26/2022   K 4.6 09/26/2022   CL 103 09/26/2022   CREATININE 0.90 09/26/2022   BUN 14 09/26/2022   CO2 21 09/26/2022   TSH 1.950 06/20/2022   HGBA1C 5.1 06/20/2022      Assessment & Plan:    Postablative hypothyroidism Assessment & Plan: [place]   Abnormal CT of the chest Assessment & Plan: Refer to pulmonology  Orders: -     Ambulatory referral to Pulmonology  Dyspnea on exertion Assessment & Plan: Refer to pulmonology.  Peak flows good.  No evidence of pulmonary edema on exam.  Orders: -     Ambulatory referral to Pulmonology  Mixed hyperlipidemia Assessment & Plan: Continue crestor, may need to adjust dose.  Recommend continue to work on eating healthy diet and exercise. Return for labs in 2-3 weeks.    Orders: -     CBC with Differential/Platelet; Future -     Comprehensive metabolic panel; Future -     Lipid panel; Future  Class 2 severe obesity due to excess calories with serious comorbidity and body mass index (BMI) of 36.0 to 36.9 in adult [facility]) Assessment & Plan: Recommend continue to work on eating healthy diet and exercise. Comorbidities: hyperlipidemia. hypothyroidism      No orders of the defined types were placed in this encounter.   Orders Placed This Encounter  Procedures   CBC with Differential/Platelet   Comprehensive metabolic panel   Lipid panel   Ambulatory referral to Pulmonology     Follow-up: No follow-ups on file.   I,Katherina A Bramblett,acting as a scribe for Rochel Brome, MD.,have documented all relevant documentation on the behalf of Rochel Brome, MD,as directed by  Rochel Brome, MD while in the presence of Rochel Brome, MD.   An After Visit Summary was printed and given to the patient.  I attest that I have reviewed this visit and agree with the plan scribed by my staff.   Rochel Brome, MD [facility] [phone]

## 2022-12-20 NOTE — Addendum Note (Signed)
Addended by: Alan Ripper A on: 12/20/2022 12:12 PM   Modules accepted: Orders

## 2022-12-26 ENCOUNTER — Encounter: Payer: Self-pay | Admitting: Family Medicine

## 2023-01-03 ENCOUNTER — Other Ambulatory Visit: Payer: Medicare Other

## 2023-01-03 ENCOUNTER — Ambulatory Visit: Payer: Medicare Other | Admitting: Family Medicine

## 2023-01-03 DIAGNOSIS — E782 Mixed hyperlipidemia: Secondary | ICD-10-CM | POA: Diagnosis not present

## 2023-01-04 LAB — CBC WITH DIFFERENTIAL/PLATELET
Basophils Absolute: 0.1 10*3/uL (ref 0.0–0.2)
Basos: 1 %
EOS (ABSOLUTE): 0.1 10*3/uL (ref 0.0–0.4)
Eos: 2 %
Hematocrit: 47.9 % — ABNORMAL HIGH (ref 34.0–46.6)
Hemoglobin: 16.1 g/dL — ABNORMAL HIGH (ref 11.1–15.9)
Immature Grans (Abs): 0 10*3/uL (ref 0.0–0.1)
Immature Granulocytes: 0 %
Lymphocytes Absolute: 1.3 10*3/uL (ref 0.7–3.1)
Lymphs: 29 %
MCH: 30.3 pg (ref 26.6–33.0)
MCHC: 33.6 g/dL (ref 31.5–35.7)
MCV: 90 fL (ref 79–97)
Monocytes Absolute: 0.5 10*3/uL (ref 0.1–0.9)
Monocytes: 11 %
Neutrophils Absolute: 2.7 10*3/uL (ref 1.4–7.0)
Neutrophils: 57 %
Platelets: 245 10*3/uL (ref 150–450)
RBC: 5.32 x10E6/uL — ABNORMAL HIGH (ref 3.77–5.28)
RDW: 12.8 % (ref 11.7–15.4)
WBC: 4.7 10*3/uL (ref 3.4–10.8)

## 2023-01-04 LAB — COMPREHENSIVE METABOLIC PANEL
ALT: 20 IU/L (ref 0–32)
AST: 22 IU/L (ref 0–40)
Albumin/Globulin Ratio: 1.8 (ref 1.2–2.2)
Albumin: 4.4 g/dL (ref 3.9–4.9)
Alkaline Phosphatase: 60 IU/L (ref 44–121)
BUN/Creatinine Ratio: 20 (ref 12–28)
BUN: 16 mg/dL (ref 8–27)
Bilirubin Total: 0.9 mg/dL (ref 0.0–1.2)
CO2: 19 mmol/L — ABNORMAL LOW (ref 20–29)
Calcium: 9.1 mg/dL (ref 8.7–10.3)
Chloride: 103 mmol/L (ref 96–106)
Creatinine, Ser: 0.8 mg/dL (ref 0.57–1.00)
Globulin, Total: 2.5 g/dL (ref 1.5–4.5)
Glucose: 91 mg/dL (ref 70–99)
Potassium: 4.4 mmol/L (ref 3.5–5.2)
Sodium: 139 mmol/L (ref 134–144)
Total Protein: 6.9 g/dL (ref 6.0–8.5)
eGFR: 82 mL/min/{1.73_m2} (ref >59)

## 2023-01-04 LAB — LIPID PANEL
Chol/HDL Ratio: 3.4 ratio (ref 0.0–4.4)
Cholesterol, Total: 154 mg/dL (ref 100–199)
HDL: 45 mg/dL (ref >39)
LDL Chol Calc (NIH): 89 mg/dL (ref 0–99)
Triglycerides: 107 mg/dL (ref 0–149)
VLDL Cholesterol Cal: 20 mg/dL (ref 5–40)

## 2023-01-04 LAB — CARDIOVASCULAR RISK ASSESSMENT

## 2023-01-09 ENCOUNTER — Encounter: Payer: Self-pay | Admitting: Internal Medicine

## 2023-01-09 ENCOUNTER — Ambulatory Visit: Payer: Medicare Other | Admitting: Internal Medicine

## 2023-01-09 VITALS — BP 124/76 | HR 68 | Temp 98.6°F | Ht 65.0 in | Wt 214.6 lb

## 2023-01-09 DIAGNOSIS — R9389 Abnormal findings on diagnostic imaging of other specified body structures: Secondary | ICD-10-CM | POA: Diagnosis not present

## 2023-01-09 DIAGNOSIS — J301 Allergic rhinitis due to pollen: Secondary | ICD-10-CM | POA: Diagnosis not present

## 2023-01-09 MED ORDER — FLUTICASONE PROPIONATE 50 MCG/ACT NA SUSP: 1.0000 | Freq: Every day | NASAL | 3 refills | Status: DC

## 2023-01-09 NOTE — Progress Notes (Signed)
Bridget Jones    [id number]    Jul 08, 1956  Primary Care Physician:Cox, Elnita Maxwell, MD  Referring Physician: Rochel Brome, MD [address] [place],  [place] [address] Reason for Consultation: "spot on lung" Date of Consultation: 01/09/2023  Chief complaint:   Chief Complaint  Patient presents with   Consult    Cough x 4 days.  SOB with strenuous exercise.  Abn CT.     HPI: Bridget Jones is a 67 y.o. woman who presents for new patient evaluation for abnormal ct chest. She had a CT coronary calcium scoring which showed some ground glass. She was referred here for further evaluation. She denies dyspnea on exertion or difficulty keeping up with peers or ADLs. No childhood or adult respiratory disease. No recurrent bronchitis or pneumonia.   She does have some allergic rhinitis which is causing a cough for the past week. She is taking cetirizine which seems to be helping but still has drainage towards the end of the day. Never had allergy testing. There is a seasonal component to this.   At the time of her CT Chest in Feb she had just finished 10 days of an antibiotics for a URI. She is now recovered from this.   Social history:  Occupation: yes, has worked in [employer] before retiring.  Exposures: lives at home with husband. Has a dog.  Smoking history: passive smoke exposure in childhood.   Social History   Occupational History   Occupation: Programmer, applications    Comment: Retired  Tobacco Use   Smoking status: Never    Passive exposure: Past   Smokeless tobacco: Never  Substance and Sexual Activity   Alcohol use: Yes    Comment: rarely ( one drink per month)   Drug use: Never   Sexual activity: Yes    Relevant family history:  Family History  Problem Relation Age of Onset   Hypertension Mother    Diabetes Father    Stroke Father    COPD Father    Hyperthyroidism Sister    Hypothyroidism Sister    Breast cancer Neg Hx     Past Medical History:  Diagnosis  Date   Graves disease    Hyperlipidemia    Hypertension     History reviewed. No pertinent surgical history.   Physical Exam: Blood pressure (!) 124/90, pulse 68, temperature 98.6 F (37 C), temperature source Oral, height 5\' 5"  (1.651 m), weight 214 lb 9.6 oz (97.3 kg), SpO2 99 %. Gen:      No acute distress ENT:  no nasal polyps, mucus membranes moist, mild erythema and nasal debris, nares patent Lungs:    No increased respiratory effort, symmetric chest wall excursion, clear to auscultation bilaterally, no wheezes or crackles CV:         Regular rate and rhythm; no murmurs, rubs, or gallops.  No pedal edema Abd:      + bowel sounds; soft, non-tender; no distension MSK: no acute synovitis of DIP or PIP joints, no mechanics hands.  Skin:      Warm and dry; no rashes Neuro: normal speech, no focal facial asymmetry Psych: alert and oriented x3, normal mood and affect   Data Reviewed/Medical Decision Making:  Independent interpretation of tests: Imaging:  Review of patient's cardiac scoring images Dec 13 2022 revealed mild GGOs in the lower lobes L>R. No focal consolidation. The patient's images have been independently reviewed by me.  PFTs:    Labs:  Lab Results  Component Value Date   NA 139 01/03/2023   K 4.4 01/03/2023   CO2 19 (L) 01/03/2023   GLUCOSE 91 01/03/2023   BUN 16 01/03/2023   CREATININE 0.80 01/03/2023   CALCIUM 9.1 01/03/2023   EGFR 82 01/03/2023   GFRNONAA 72 11/11/2020   Lab Results  Component Value Date   WBC 4.7 01/03/2023   HGB 16.1 (H) 01/03/2023   HCT 47.9 (H) 01/03/2023   MCV 90 01/03/2023   PLT 245 01/03/2023     Immunization status:  Immunization History  Administered Date(s) Administered   PFIZER(Purple Top)SARS-COV-2 Vaccination 01/11/2020, 02/01/2020   PNEUMOCOCCAL CONJUGATE-20 06/20/2022   Tdap 05/25/2019   Zoster Recombinat (Shingrix) 11/16/2020, 01/16/2021     I reviewed prior external note(s) from cardiologist  I  reviewed the result(s) of the labs and imaging as noted above.   I have ordered CT Chest  Assessment:  Uncontrolled seasonal allergic rhinitis Abnormal CT Chest  Plan/Recommendations:  Ct scan of your chest in 1 month to follow up on the scan in February. Most likely this is in the setting of patient having respiratory symptoms at the time of scan. Will repeat to ensure resolution.   Keep taking cetirizine daily. You can add flonase.    Return to Care: Return in about 4 weeks (around 02/06/2023).  Lenice Llamas, MD Pulmonary and Critical Care Medicine [place]  CC: Rochel Brome, MD

## 2023-01-09 NOTE — Patient Instructions (Signed)
Please schedule follow up scheduled with myself in 1 month.  If my schedule is not open yet, we will contact you with a reminder closer to that time. Please call [phone] if you haven't heard from [place] a month before.   Before your next visit I would like you to have: Ct scan of your chest in 1 month we will schedule this for you and call you. Your appointment should be after this scan.   Keep taking cetirizine daily. You can add flonase.   Flonase - 1 spray on each side of your nose twice a day for first week, then 1 spray on each side.   Instructions for use: If you also use a saline nasal spray or rinse, use that first. Position the head with the chin slightly tucked. Use the right hand to spray into the left nostril and the right hand to spray into the left nostril.   Point the bottle away from the septum of your nose (cartilage that divides the two sides of your nose).  Hold the nostril closed on the opposite side from where you will spray Spray once and gently sniff to pull the medicine into the higher parts of your nose.  Don't sniff too hard as the medicine will drain down the back of your throat instead. Repeat with a second spray on the same side if prescribed. Repeat on the other side of your nose.

## 2023-01-16 ENCOUNTER — Encounter: Payer: Self-pay | Admitting: Family Medicine

## 2023-01-29 DIAGNOSIS — K080 Exfoliation of teeth due to systemic causes: Secondary | ICD-10-CM | POA: Diagnosis not present

## 2023-02-11 ENCOUNTER — Ambulatory Visit
Admission: RE | Admit: 2023-02-11 | Discharge: 2023-02-11 | Disposition: A | Discharge status: Home / Self Care | Payer: Medicare Other | Source: Ambulatory Visit | Attending: Internal Medicine | Admitting: Internal Medicine

## 2023-02-11 DIAGNOSIS — I700 Atherosclerosis of aorta: Secondary | ICD-10-CM | POA: Diagnosis not present

## 2023-02-11 DIAGNOSIS — R931 Abnormal findings on diagnostic imaging of heart and coronary circulation: Secondary | ICD-10-CM | POA: Diagnosis not present

## 2023-02-11 DIAGNOSIS — R9389 Abnormal findings on diagnostic imaging of other specified body structures: Secondary | ICD-10-CM

## 2023-02-13 ENCOUNTER — Ambulatory Visit: Payer: Medicare Other | Attending: Cardiology | Admitting: Cardiology

## 2023-02-13 ENCOUNTER — Encounter: Payer: Self-pay | Admitting: Cardiology

## 2023-02-13 VITALS — BP 122/80 | HR 70 | Ht 65.0 in | Wt 217.6 lb

## 2023-02-13 DIAGNOSIS — R9389 Abnormal findings on diagnostic imaging of other specified body structures: Secondary | ICD-10-CM | POA: Diagnosis not present

## 2023-02-13 DIAGNOSIS — R0609 Other forms of dyspnea: Secondary | ICD-10-CM

## 2023-02-13 DIAGNOSIS — E782 Mixed hyperlipidemia: Secondary | ICD-10-CM | POA: Diagnosis not present

## 2023-02-13 DIAGNOSIS — R931 Abnormal findings on diagnostic imaging of heart and coronary circulation: Secondary | ICD-10-CM

## 2023-02-13 MED ORDER — ROSUVASTATIN CALCIUM 20 MG PO TABS: 20.0000 mg | ORAL_TABLET | Freq: Every day | ORAL | 3 refills | Status: DC

## 2023-02-13 NOTE — Progress Notes (Signed)
Cardiology Office Note:    Date:  02/13/2023   ID:  Bridget Jones, DOB [birth year], MRN [id number]  PCP:  Blane Ohara, MD  Cardiologist:  Gypsy Balsam, MD    Referring MD: Blane Ohara, MD   Chief Complaint  Patient presents with   Follow-up    History of Present Illness:    Bridget Jones is a 67 y.o. female with past medical history significant for essential hypertension, hyperlipidemia, she never smoked she does have family history of coronary artery disease she was referred to [place] for evaluation of her coronary status.  She did have coronary calcium score done which showed calcium score of 26 that is 67 percentile.  She is here to talk about this.  She is doing well denies have any chest pain tightness squeezing pressure burning chest trying to exercise on the regular basis.  Past Medical History:  Diagnosis Date   Graves disease    Hyperlipidemia    Hypertension     History reviewed. No pertinent surgical history.  Current Medications: Current Meds  Medication Sig   calcium carbonate (OSCAL) 1500 (600 Ca) MG TABS tablet Take 1,500 mg by mouth 2 (two) times daily with a meal.   Cholecalciferol (D3 PO) Take 1 tablet by mouth daily.   Coenzyme Q10 (CO Q 10 PO) Take 1 tablet by mouth daily.   fluticasone (FLONASE) 50 MCG/ACT nasal spray Place 1 spray into both nostrils daily. (Patient taking differently: Place 1 spray into both nostrils daily as needed for allergies or rhinitis.)   KRILL OIL PO Take 1 tablet by mouth daily.   Magnesium Citrate 200 MG TABS Take 1 tablet by mouth daily.   Multiple Vitamin (MULTIVITAMIN) tablet Take 1 tablet by mouth daily.   psyllium (REGULOID) 0.52 g capsule Take 0.52 g by mouth daily.   rosuvastatin (CRESTOR) 10 MG tablet Take 1 tablet (10 mg total) by mouth daily.   SYNTHROID 125 MCG tablet TAKE 1 TABLET (125 MCG TOTAL) DAILY BEFORE BREAKFAST (Patient taking differently: Take 125 mcg by mouth daily before breakfast.)   Turmeric (QC TUMERIC  COMPLEX PO) Take 1 tablet by mouth 2 (two) times daily at 10 AM and 5 PM.     Allergies:   Atorvastatin   Social History   Socioeconomic History   Marital status: Married    Spouse name: Not on file   Number of children: 2   Years of education: Not on file   Highest education level: Not on file  Occupational History   Occupation: human resources    Comment: Retired  Tobacco Use   Smoking status: Never    Passive exposure: Past   Smokeless tobacco: Never  Substance and Sexual Activity   Alcohol use: Yes    Comment: rarely ( one drink per month)   Drug use: Never   Sexual activity: Yes  Other Topics Concern   Not on file  Social History Narrative   Not on file   Social Determinants of Health   Financial Resource Strain: Low Risk  (06/20/2022)   Overall Financial Resource Strain (CARDIA)    Difficulty of Paying Living Expenses: Not hard at all  Food Insecurity: No Food Insecurity (06/20/2022)   Hunger Vital Sign    Worried About Running Out of Food in the Last Year: Never true    Ran Out of Food in the Last Year: Never true  Transportation Needs: No Transportation Needs (06/20/2022)   PRAPARE - Transportation    Lack  of Transportation (Medical): No    Lack of Transportation (Non-Medical): No  Physical Activity: Sufficiently Active (06/20/2022)   Exercise Vital Sign    Days of Exercise per Week: 7 days    Minutes of Exercise per Session: 30 min  Stress: No Stress Concern Present (06/20/2022)   [employer] of Occupational Health - Occupational Stress Questionnaire    Feeling of Stress : Not at all  Social Connections: Socially Integrated (06/20/2022)   Social Connection and Isolation Panel [NHANES]    Frequency of Communication with Friends and Family: More than three times a week    Frequency of Social Gatherings with Friends and Family: Twice a week    Attends Religious Services: More than 4 times per year    Active Member of [employer] or Organizations: Yes    Attends Occupational hygienist Meetings: 1 to 4 times per year    Marital Status: Married     Family History: The patient's family history includes COPD in her father; Diabetes in her father; Hypertension in her mother; Hyperthyroidism in her sister; Hypothyroidism in her sister; Stroke in her father. There is no history of Breast cancer. ROS:   Please see the history of present illness.    All 14 point review of systems negative except as described per history of present illness  EKGs/Labs/Other Studies Reviewed:      Recent Labs: 06/20/2022: TSH 1.950 01/03/2023: ALT 20; BUN 16; Creatinine, Ser 0.80; Hemoglobin 16.1; Platelets 245; Potassium 4.4; Sodium 139  Recent Lipid Panel    Component Value Date/Time   CHOL 154 01/03/2023 0758   TRIG 107 01/03/2023 0758   HDL 45 01/03/2023 0758   CHOLHDL 3.4 01/03/2023 0758   LDLCALC 89 01/03/2023 0758    Physical Exam:    VS:  BP 122/80 (BP Location: Left Arm, Patient Position: Sitting)   Pulse 70   Ht 5\' 5"  (1.651 m)   Wt 217 lb 9.6 oz (98.7 kg)   SpO2 96%   BMI 36.21 kg/m     Wt Readings from Last 3 Encounters:  02/13/23 217 lb 9.6 oz (98.7 kg)  01/09/23 214 lb 9.6 oz (97.3 kg)  12/20/22 217 lb (98.4 kg)     GEN:  Well nourished, well developed in no acute distress HEENT: Normal NECK: No JVD; No carotid bruits LYMPHATICS: No lymphadenopathy CARDIAC: RRR, no murmurs, no rubs, no gallops RESPIRATORY:  Clear to auscultation without rales, wheezing or rhonchi  ABDOMEN: Soft, non-tender, non-distended MUSCULOSKELETAL:  No edema; No deformity  SKIN: Warm and dry LOWER EXTREMITIES: no swelling NEUROLOGIC:  Alert and oriented x 3 PSYCHIATRIC:  Normal affect   ASSESSMENT:    1. Abnormal CT of the chest   2. Dyspnea on exertion   3. Mixed hyperlipidemia   4. Agatston coronary artery calcium score less than 100    PLAN:    In order of problems listed above:  Coronary calcifications calcium score 26 which is actively low 67%) the key is  risk factors modifications.  She is taking antiplatelet therapy already which advised to continue, she is also taking Crestor I will increase dose from 10-20 I would like to see has her LDL less than 70. Dyslipidemia I did review K PN which show her LDL 89 HDL 45.  Will increase Crestor from 10-20. Dyspnea exertion echocardiogram showed preserved left ventricle ejection fraction.  I encouraged her to be more active. We did talk about risk factors modification I recommended 5 times a week at  least 30 minutes moderate intensity exercise, we discussed also about Mediterranean diet   Medication Adjustments/Labs and Tests Ordered: Current medicines are reviewed at length with the patient today.  Concerns regarding medicines are outlined above.  No orders of the defined types were placed in this encounter.  Medication changes: No orders of the defined types were placed in this encounter.   Signed, Georgeanna Lea, MD, [facility] 02/13/2023 1:24 PM    [facility]

## 2023-02-13 NOTE — Addendum Note (Signed)
Addended by: Baldo Ash D on: 02/13/2023 01:40 PM   Modules accepted: Orders

## 2023-02-13 NOTE — Patient Instructions (Addendum)
Medication Instructions:   INCREASE: Crestor to 20mg  daily- you may double your dose until your next refill and it will reflect your new dose.   Lab Work: Your physician recommends that you return for lab work in: 6 weeks You need to have labs done when you are fasting.  You can come Monday through Friday 8:30 am to 12:00 pm and 1:15 to 4:30. You do not need to make an appointment as the order has already been placed. The labs you are going to have done are AST, ALT,  Lipids.    Testing/Procedures: None Ordered   Follow-Up: At [facility], you and your health needs are our priority.  As part of our continuing mission to provide you with exceptional heart care, we have created designated Provider Care Teams.  These Care Teams include your primary Cardiologist (physician) and Advanced Practice Providers (APPs -  Physician Assistants and Nurse Practitioners) who all work together to provide you with the care you need, when you need it.  We recommend signing up for the patient portal called "MyChart".  Sign up information is provided on this After Visit Summary.  MyChart is used to connect with patients for Virtual Visits (Telemedicine).  Patients are able to view lab/test results, encounter notes, upcoming appointments, etc.  Non-urgent messages can be sent to your provider as well.   To learn more about what you can do with MyChart, go to [email or web address].    Your next appointment:   12 month(s)  The format for your next appointment:   In Person  Provider:   Gypsy Balsam, MD    Other Instructions NA

## 2023-02-14 ENCOUNTER — Encounter: Payer: Self-pay | Admitting: Internal Medicine

## 2023-02-14 ENCOUNTER — Ambulatory Visit: Payer: Medicare Other | Admitting: Internal Medicine

## 2023-02-14 VITALS — BP 122/78 | HR 71 | Temp 98.1°F | Ht 65.0 in | Wt 217.8 lb

## 2023-02-14 DIAGNOSIS — J301 Allergic rhinitis due to pollen: Secondary | ICD-10-CM

## 2023-02-14 DIAGNOSIS — R9389 Abnormal findings on diagnostic imaging of other specified body structures: Secondary | ICD-10-CM

## 2023-02-14 NOTE — Patient Instructions (Signed)
Please schedule follow up scheduled with myself in 6 months.  If my schedule is not open yet, we will contact you with a reminder closer to that time. Please call [phone] if you haven't heard from [place] a month before.   Before your next visit I would like you to have: Full set of PFTs  I will contact you after the CT scan is interpreted to see if we need to change our plan. I think we just monitor these changes conservatively for now.

## 2023-02-14 NOTE — Progress Notes (Signed)
Bridget Jones    [id number]    07/12/1956  Primary Care Physician:Cox, Fritzi Mandes, MD Date of Appointment: 02/14/2023 Established Patient Visit  Chief complaint:   Chief Complaint  Patient presents with   Follow-up    Pt here to discuss CT results Cough and SOB have improved. Flonase worked really well     HPI: Bridget Jones is a 67 y.o. woman with past medical history of seasonal allergic rhinitis. Initially presented for incidental findings on CT coronary calcium  Interval Updates: Here for follow up. Cough imrpoved with flonase and cetirizine. Feels in her usual state of health. Had CT Chest for follow up earlier this week. No dyspnea, wheezing, shortness of breath, chest tightness. Appetite is fine.  She does have some acid reflux which she says taking lemon juice is helping improve this.   I have reviewed the patient's family social and past medical history and updated as appropriate.   Past Medical History:  Diagnosis Date   Graves disease    Hyperlipidemia    Hypertension     History reviewed. No pertinent surgical history.  Family History  Problem Relation Age of Onset   Hypertension Mother    Diabetes Father    Stroke Father    COPD Father    Hyperthyroidism Sister    Hypothyroidism Sister    Breast cancer Neg Hx     Social History   Occupational History   Occupation: Presenter, broadcasting    Comment: Retired  Tobacco Use   Smoking status: Never    Passive exposure: Past   Smokeless tobacco: Never  Substance and Sexual Activity   Alcohol use: Yes    Comment: rarely ( one drink per month)   Drug use: Never   Sexual activity: Yes     Physical Exam: Blood pressure 122/78, pulse 71, temperature 98.1 F (36.7 C), temperature source Oral, height 5\' 5"  (1.651 m), weight 217 lb 12.8 oz (98.8 kg), SpO2 97 %.  Gen:      No acute distress ENT:  no nasal polyps, mucus membranes moist Lungs:    No increased respiratory effort, symmetric chest wall  excursion, clear to auscultation bilaterally, no wheezes or crackles CV:         Regular rate and rhythm; no murmurs, rubs, or gallops.  No pedal edema   Data Reviewed: Imaging: I have personally reviewed the CT Chest 4/29 shows diffuse ground glass opacities lower lobe predominant   PFTs:      No data to display         I have personally reviewed the patient's PFTs and   Labs: Lab Results  Component Value Date   NA 139 01/03/2023   K 4.4 01/03/2023   CO2 19 (L) 01/03/2023   GLUCOSE 91 01/03/2023   BUN 16 01/03/2023   CREATININE 0.80 01/03/2023   CALCIUM 9.1 01/03/2023   EGFR 82 01/03/2023   GFRNONAA 72 11/11/2020     Immunization status: Immunization History  Administered Date(s) Administered   PFIZER(Purple Top)SARS-COV-2 Vaccination 01/11/2020, 02/01/2020   PNEUMOCOCCAL CONJUGATE-20 06/20/2022   Tdap 05/25/2019   Zoster Recombinat (Shingrix) 11/16/2020, 01/16/2021    External Records Personally Reviewed:    Assessment:  Abnormal CT Chest Post nasal drainage GERD  Plan/Recommendations: Changes in the CT Chest seem stable when compared to the February cardiac scan. She seems asymptomatic completely from this. The changes certainly could be related to ongoing reflux with the bibasilar predominance and  mix of ground glass with some borderline bronchiectasis. Normal saturations, no crackles on exam. Doubt ILD.   Will obtain Full set of PFTs to obtain baseline lung function  Discussed possibility of bronchoscopy and biopsy, but with her feeling so well right now, hard to justify doing a procedure.  I will contact you after the CT scan is interpreted to see if we need to change our plan. I think we just monitor these changes conservatively for now.    Return to Care: Return in about 6 months (around 08/17/2023) for next available PFT, follow up after in 6 months.   Durel Salts, MD Pulmonary and Critical Care Medicine [facility] Office:[phone]

## 2023-04-05 ENCOUNTER — Other Ambulatory Visit: Payer: Self-pay | Admitting: Family Medicine

## 2023-04-05 ENCOUNTER — Other Ambulatory Visit: Payer: Self-pay | Admitting: Physician Assistant

## 2023-04-05 MED ORDER — ROSUVASTATIN CALCIUM 20 MG PO TABS: 20.0000 mg | ORAL_TABLET | Freq: Every day | ORAL | 0 refills | Status: DC

## 2023-04-08 NOTE — Progress Notes (Signed)
Subjective:  Patient ID: Bridget Jones, female    DOB: 06/21/1956  Age: 67 y.o. MRN: [id number]  Chief Complaint  Patient presents with   Medical Management of Chronic Issues    HPI Patient has a history of hyperlipidemia with LDL 186.  On Crestor 20 mg daily and takes coenzyme Q 10.  Patient held crestor for the last 3 days and left knee pain which was new since starting crestor has resolved. Did not happen with 10 mg before bed. Eating healthy 80% of the time. Exercise: daily until the last week due to the heat and lack of energy.    Acquired hypothyroidism: Currently on Synthroid 125 mcg daily.  Cough x 1 week. Became productive yesterday. Yellow sputum. No rhinorrhea. No shortness of breath. Taking theraflu.      04/11/2023    8:44 AM 12/20/2022   10:47 AM 06/20/2022    9:04 AM 09/19/2021    8:59 AM 06/21/2020   12:17 AM  Depression screen PHQ 2/9  Decreased Interest 0 0 0 0 0  Down, Depressed, Hopeless 0 0 0 0 0  PHQ - 2 Score 0 0 0 0 0  Altered sleeping 0      Tired, decreased energy 0      Change in appetite 0      Feeling bad or failure about yourself  0      Trouble concentrating 0      Moving slowly or fidgety/restless 0      Suicidal thoughts 0      PHQ-9 Score 0      Difficult doing work/chores Not difficult at all            04/11/2023    8:44 AM  Fall Risk   Falls in the past year? 0  Number falls in past yr: 0  Injury with Fall? 0  Risk for fall due to : No Fall Risks  Follow up Falls evaluation completed    Patient Care Team: Blane Ohara, MD as PCP - General (Family Medicine) Georgeanna Lea, MD as Consulting Physician (Cardiology)   Review of Systems  Constitutional:  Negative for chills, fatigue and fever.  HENT:  Positive for congestion. Negative for ear pain, rhinorrhea and sore throat.   Respiratory:  Positive for cough (yellowish). Negative for shortness of breath.   Cardiovascular:  Negative for chest pain.  Gastrointestinal:  Negative for  abdominal pain, constipation, diarrhea, nausea and vomiting.  Genitourinary:  Negative for dysuria and urgency.  Musculoskeletal:  Positive for arthralgias (BL KNEES. Uses tumeric and occasional tylenol.) and gait problem. Negative for back pain and myalgias.  Neurological:  Negative for dizziness, weakness, light-headedness and headaches.  Psychiatric/Behavioral:  Negative for dysphoric mood. The patient is not nervous/anxious.     Current Outpatient Medications on File Prior to Visit  Medication Sig Dispense Refill   calcium carbonate (OSCAL) 1500 (600 Ca) MG TABS tablet Take 1,500 mg by mouth 2 (two) times daily with a meal.     Cholecalciferol (D3 PO) Take 1 tablet by mouth daily.     Coenzyme Q10 (CO Q 10 PO) Take 1 tablet by mouth daily.     fluticasone (FLONASE) 50 MCG/ACT nasal spray Place 1 spray into both nostrils daily. (Patient taking differently: Place 1 spray into both nostrils daily as needed for allergies or rhinitis.) 16 g 3   KRILL OIL PO Take 1 tablet by mouth daily.     Magnesium Citrate 200 MG TABS  Take 1 tablet by mouth daily.     Multiple Vitamin (MULTIVITAMIN) tablet Take 1 tablet by mouth daily.     psyllium (REGULOID) 0.52 g capsule Take 0.52 g by mouth daily.     rosuvastatin (CRESTOR) 20 MG tablet Take 1 tablet (20 mg total) by mouth daily. 90 tablet 0   SYNTHROID 125 MCG tablet TAKE 1 TABLET (125 MCG TOTAL) DAILY BEFORE BREAKFAST (Patient taking differently: Take 125 mcg by mouth daily before breakfast.) 90 tablet 3   Turmeric (QC TUMERIC COMPLEX PO) Take 1 tablet by mouth 2 (two) times daily at 10 AM and 5 PM.     No current facility-administered medications on file prior to visit.   Past Medical History:  Diagnosis Date   Graves disease    Hyperlipidemia    Hypertension    History reviewed. No pertinent surgical history.  Family History  Problem Relation Age of Onset   Hypertension Mother    Diabetes Father    Stroke Father    COPD Father     Hyperthyroidism Sister    Hypothyroidism Sister    Breast cancer Neg Hx    Social History   Socioeconomic History   Marital status: Married    Spouse name: Not on file   Number of children: 2   Years of education: Not on file   Highest education level: 12th grade  Occupational History   Occupation: Presenter, broadcasting    Comment: Retired  Tobacco Use   Smoking status: Never    Passive exposure: Past   Smokeless tobacco: Never  Substance and Sexual Activity   Alcohol use: Yes    Comment: rarely ( one drink per month)   Drug use: Never   Sexual activity: Yes  Other Topics Concern   Not on file  Social History Narrative   Not on file   Social Determinants of Health   Financial Resource Strain: Low Risk  (04/07/2023)   Overall Financial Resource Strain (CARDIA)    Difficulty of Paying Living Expenses: Not hard at all  Food Insecurity: No Food Insecurity (04/07/2023)   Hunger Vital Sign    Worried About Running Out of Food in the Last Year: Never true    Ran Out of Food in the Last Year: Never true  Transportation Needs: No Transportation Needs (04/07/2023)   PRAPARE - Administrator, Civil Service (Medical): No    Lack of Transportation (Non-Medical): No  Physical Activity: Insufficiently Active (04/07/2023)   Exercise Vital Sign    Days of Exercise per Week: 3 days    Minutes of Exercise per Session: 30 min  Stress: No Stress Concern Present (04/07/2023)   [employer] of Occupational Health - Occupational Stress Questionnaire    Feeling of Stress : Not at all  Social Connections: Socially Integrated (04/07/2023)   Social Connection and Isolation Panel [NHANES]    Frequency of Communication with Friends and Family: Twice a week    Frequency of Social Gatherings with Friends and Family: Three times a week    Attends Religious Services: More than 4 times per year    Active Member of Clubs or Organizations: Yes    Attends Banker Meetings: More than  4 times per year    Marital Status: Married    Objective:  BP 124/72   Pulse 72   Temp (!) 97.2 F (36.2 C)   Ht 5\' 5"  (1.651 m)   Wt 215 lb (97.5 kg)   SpO2  96%   BMI 35.78 kg/m      04/11/2023    8:42 AM 02/14/2023    8:52 AM 02/13/2023    1:06 PM  BP/Weight  Systolic BP 124 122 122  Diastolic BP 72 78 80  Wt. (Lbs) 215 217.8 217.6  BMI 35.78 kg/m2 36.24 kg/m2 36.21 kg/m2    Physical Exam Vitals reviewed.  Constitutional:      Appearance: Normal appearance. She is normal weight.  HENT:     Right Ear: Tympanic membrane, ear canal and external ear normal.     Left Ear: Tympanic membrane, ear canal and external ear normal.     Nose: Nose normal.     Mouth/Throat:     Pharynx: Oropharynx is clear.  Neck:     Vascular: No carotid bruit.  Cardiovascular:     Rate and Rhythm: Normal rate and regular rhythm.     Heart sounds: Murmur heard.  Pulmonary:     Effort: Pulmonary effort is normal. No respiratory distress.     Breath sounds: Normal breath sounds.  Abdominal:     General: Abdomen is flat. Bowel sounds are normal.     Palpations: Abdomen is soft.     Tenderness: There is no abdominal tenderness.  Lymphadenopathy:     Cervical: No cervical adenopathy.  Neurological:     Mental Status: She is alert and oriented to person, place, and time.  Psychiatric:        Mood and Affect: Mood normal.        Behavior: Behavior normal.     Diabetic Foot Exam - Simple   No data filed      Lab Results  Component Value Date   WBC 4.7 04/11/2023   HGB 16.6 (H) 04/11/2023   HCT 49.8 (H) 04/11/2023   PLT 259 04/11/2023   GLUCOSE 91 04/11/2023   CHOL 230 (H) 04/11/2023   TRIG 162 (H) 04/11/2023   HDL 48 04/11/2023   LDLCALC 153 (H) 04/11/2023   ALT 23 04/11/2023   AST 26 04/11/2023   NA 142 04/11/2023   K 4.5 04/11/2023   CL 104 04/11/2023   CREATININE 0.89 04/11/2023   BUN 14 04/11/2023   CO2 23 04/11/2023   TSH 2.790 04/11/2023   HGBA1C 5.1 06/20/2022       Assessment & Plan:    Postablative hypothyroidism Assessment & Plan: At goal. Check labs.   Orders: -     T4, free -     TSH  Mixed hyperlipidemia Assessment & Plan: Continue crestor, may need to adjust dose.  Recommend continue to work on eating healthy diet and exercise.   Orders: -     Lipid panel  Essential hypertension Assessment & Plan: Well controlled on no medicines.  Recommend continue to work on eating healthy diet and exercise.   Orders: -     CBC with Differential/Platelet -     Comprehensive metabolic panel  Acute nasopharyngitis Assessment & Plan: URI education given. Rest, Fluids.   Myalgia due to statin Assessment & Plan: Intolerant to statins. Will try lower dose of crestor.       No orders of the defined types were placed in this encounter.   Orders Placed This Encounter  Procedures   CBC with Differential/Platelet   Comprehensive metabolic panel   Lipid panel   T4, free   TSH     Follow-up: Return in about 3 months (around 07/12/2023) for chronic fasting.   I,Katherina A Bramblett,acting as  a scribe for Blane Ohara, MD.,have documented all relevant documentation on the behalf of Blane Ohara, MD,as directed by  Blane Ohara, MD while in the presence of Blane Ohara, MD.   An After Visit Summary was printed and given to the patient.  I attest that I have reviewed this visit and agree with the plan scribed by my staff.   Blane Ohara, MD [facility] [phone]

## 2023-04-08 NOTE — Assessment & Plan Note (Signed)
Continue crestor, may need to adjust dose.  Recommend continue to work on eating healthy diet and exercise.

## 2023-04-08 NOTE — Assessment & Plan Note (Addendum)
At goal.  Check labs. 

## 2023-04-11 ENCOUNTER — Encounter: Payer: Self-pay | Admitting: Family Medicine

## 2023-04-11 ENCOUNTER — Ambulatory Visit (INDEPENDENT_AMBULATORY_CARE_PROVIDER_SITE_OTHER): Payer: Medicare Other | Admitting: Family Medicine

## 2023-04-11 VITALS — BP 124/72 | HR 72 | Temp 97.2°F | Ht 65.0 in | Wt 215.0 lb

## 2023-04-11 DIAGNOSIS — E782 Mixed hyperlipidemia: Secondary | ICD-10-CM | POA: Diagnosis not present

## 2023-04-11 DIAGNOSIS — E890 Postprocedural hypothyroidism: Secondary | ICD-10-CM

## 2023-04-11 DIAGNOSIS — I10 Essential (primary) hypertension: Secondary | ICD-10-CM

## 2023-04-11 DIAGNOSIS — M791 Myalgia, unspecified site: Secondary | ICD-10-CM

## 2023-04-11 DIAGNOSIS — J00 Acute nasopharyngitis [common cold]: Secondary | ICD-10-CM | POA: Insufficient documentation

## 2023-04-11 DIAGNOSIS — T466X5A Adverse effect of antihyperlipidemic and antiarteriosclerotic drugs, initial encounter: Secondary | ICD-10-CM

## 2023-04-11 DIAGNOSIS — R0609 Other forms of dyspnea: Secondary | ICD-10-CM

## 2023-04-11 DIAGNOSIS — R051 Acute cough: Secondary | ICD-10-CM | POA: Insufficient documentation

## 2023-04-12 LAB — COMPREHENSIVE METABOLIC PANEL
ALT: 23 IU/L (ref 0–32)
AST: 26 IU/L (ref 0–40)
Albumin: 4.5 g/dL (ref 3.9–4.9)
Alkaline Phosphatase: 67 IU/L (ref 44–121)
BUN/Creatinine Ratio: 16 (ref 12–28)
BUN: 14 mg/dL (ref 8–27)
Bilirubin Total: 0.8 mg/dL (ref 0.0–1.2)
CO2: 23 mmol/L (ref 20–29)
Calcium: 9.2 mg/dL (ref 8.7–10.3)
Chloride: 104 mmol/L (ref 96–106)
Creatinine, Ser: 0.89 mg/dL (ref 0.57–1.00)
Globulin, Total: 2.4 g/dL (ref 1.5–4.5)
Glucose: 91 mg/dL (ref 70–99)
Potassium: 4.5 mmol/L (ref 3.5–5.2)
Sodium: 142 mmol/L (ref 134–144)
Total Protein: 6.9 g/dL (ref 6.0–8.5)
eGFR: 72 mL/min/{1.73_m2} (ref >59)

## 2023-04-12 LAB — LIPID PANEL
Chol/HDL Ratio: 4.8 ratio — ABNORMAL HIGH (ref 0.0–4.4)
Cholesterol, Total: 230 mg/dL — ABNORMAL HIGH (ref 100–199)
HDL: 48 mg/dL (ref >39)
LDL Chol Calc (NIH): 153 mg/dL — ABNORMAL HIGH (ref 0–99)
Triglycerides: 162 mg/dL — ABNORMAL HIGH (ref 0–149)
VLDL Cholesterol Cal: 29 mg/dL (ref 5–40)

## 2023-04-12 LAB — CBC WITH DIFFERENTIAL/PLATELET
Basophils Absolute: 0.1 10*3/uL (ref 0.0–0.2)
Basos: 1 %
EOS (ABSOLUTE): 0.3 10*3/uL (ref 0.0–0.4)
Eos: 6 %
Hematocrit: 49.8 % — ABNORMAL HIGH (ref 34.0–46.6)
Hemoglobin: 16.6 g/dL — ABNORMAL HIGH (ref 11.1–15.9)
Immature Grans (Abs): 0 10*3/uL (ref 0.0–0.1)
Immature Granulocytes: 0 %
Lymphocytes Absolute: 1.3 10*3/uL (ref 0.7–3.1)
Lymphs: 27 %
MCH: 30.2 pg (ref 26.6–33.0)
MCHC: 33.3 g/dL (ref 31.5–35.7)
MCV: 91 fL (ref 79–97)
Monocytes Absolute: 0.4 10*3/uL (ref 0.1–0.9)
Monocytes: 9 %
Neutrophils Absolute: 2.7 10*3/uL (ref 1.4–7.0)
Neutrophils: 57 %
Platelets: 259 10*3/uL (ref 150–450)
RBC: 5.49 x10E6/uL — ABNORMAL HIGH (ref 3.77–5.28)
RDW: 13.3 % (ref 11.7–15.4)
WBC: 4.7 10*3/uL (ref 3.4–10.8)

## 2023-04-12 LAB — TSH: TSH: 2.79 u[IU]/mL (ref 0.450–4.500)

## 2023-04-12 LAB — T4, FREE: Free T4: 1.56 ng/dL (ref 0.82–1.77)

## 2023-04-13 NOTE — Assessment & Plan Note (Signed)
Well controlled on no medicines.  Recommend continue to work on eating healthy diet and exercise.  

## 2023-04-14 NOTE — Assessment & Plan Note (Signed)
Intolerant to statins. Will try lower dose of crestor.

## 2023-04-14 NOTE — Assessment & Plan Note (Signed)
URI education given. Rest, Fluids.

## 2023-04-15 ENCOUNTER — Other Ambulatory Visit: Payer: Self-pay | Admitting: Physician Assistant

## 2023-04-15 MED ORDER — ROSUVASTATIN CALCIUM 10 MG PO TABS: 10.0000 mg | ORAL_TABLET | Freq: Every day | ORAL | 0 refills | Status: DC

## 2023-06-24 ENCOUNTER — Ambulatory Visit: Payer: Medicare Other

## 2023-07-08 ENCOUNTER — Ambulatory Visit (INDEPENDENT_AMBULATORY_CARE_PROVIDER_SITE_OTHER): Payer: Medicare Other

## 2023-07-08 VITALS — BP 122/78 | HR 68 | Resp 16 | Ht 65.0 in | Wt 215.0 lb

## 2023-07-08 DIAGNOSIS — Z1231 Encounter for screening mammogram for malignant neoplasm of breast: Secondary | ICD-10-CM

## 2023-07-08 DIAGNOSIS — Z0000 Encounter for general adult medical examination without abnormal findings: Secondary | ICD-10-CM | POA: Diagnosis not present

## 2023-07-08 NOTE — Patient Instructions (Signed)
Bridget Jones , Thank you for taking time to come for your Medicare Wellness Visit. I appreciate your ongoing commitment to your health goals. Please review the following plan we discussed and let me know if I can assist you in the future.    This is a list of the screening recommended for you and due dates:  Health Maintenance  Topic Date Due   COVID-19 Vaccine (3 - 2023-24 season) 06/16/2023   Flu Shot  01/13/2024*   Medicare Annual Wellness Visit  07/07/2024   Mammogram  10/11/2024   Cologuard (Stool DNA test)  11/14/2025   DTaP/Tdap/Td vaccine (2 - Td or Tdap) 05/24/2029   Pneumonia Vaccine  Completed   DEXA scan (bone density measurement)  Completed   Hepatitis C Screening  Completed   Zoster (Shingles) Vaccine  Completed   HPV Vaccine  Aged Out  *Topic was postponed. The date shown is not the original due date.    Preventive Care 16 Years and Older, Female Preventive care refers to lifestyle choices and visits with your health care provider that can promote health and wellness. What does preventive care include? A yearly physical exam. This is also called an annual well check. Dental exams once or twice a year. Routine eye exams. Ask your health care provider how often you should have your eyes checked. Personal lifestyle choices, including: Daily care of your teeth and gums. Regular physical activity. Eating a healthy diet. Avoiding tobacco and drug use. Limiting alcohol use. Practicing safe sex. Taking low-dose aspirin every day. Taking vitamin and mineral supplements as recommended by your health care provider. What happens during an annual well check? The services and screenings done by your health care provider during your annual well check will depend on your age, overall health, lifestyle risk factors, and family history of disease. Counseling  Your health care provider may ask you questions about your: Alcohol use. Tobacco use. Drug use. Emotional well-being. Home  and relationship well-being. Sexual activity. Eating habits. History of falls. Memory and ability to understand (cognition). Work and work Astronomer. Reproductive health. Screening  You may have the following tests or measurements: Height, weight, and BMI. Blood pressure. Lipid and cholesterol levels. These may be checked every 5 years, or more frequently if you are over 74 years old. Skin check. Lung cancer screening. You may have this screening every year starting at age 49 if you have a 30-pack-year history of smoking and currently smoke or have quit within the past 15 years. Fecal occult blood test (FOBT) of the stool. You may have this test every year starting at age 62. Flexible sigmoidoscopy or colonoscopy. You may have a sigmoidoscopy every 5 years or a colonoscopy every 10 years starting at age 34. Hepatitis C blood test. Hepatitis B blood test. Sexually transmitted disease (STD) testing. Diabetes screening. This is done by checking your blood sugar (glucose) after you have not eaten for a while (fasting). You may have this done every 1-3 years. Bone density scan. This is done to screen for osteoporosis. You may have this done starting at age 64. Mammogram. This may be done every 1-2 years. Talk to your health care provider about how often you should have regular mammograms. Talk with your health care provider about your test results, treatment options, and if necessary, the need for more tests. Vaccines  Your health care provider may recommend certain vaccines, such as: Influenza vaccine. This is recommended every year. Tetanus, diphtheria, and acellular pertussis (Tdap, Td) vaccine. You  may need a Td booster every 10 years. Zoster vaccine. You may need this after age 11. Pneumococcal 13-valent conjugate (PCV13) vaccine. One dose is recommended after age 40. Pneumococcal polysaccharide (PPSV23) vaccine. One dose is recommended after age 3. Talk to your health care provider  about which screenings and vaccines you need and how often you need them. This information is not intended to replace advice given to you by your health care provider. Make sure you discuss any questions you have with your health care provider. Document Released: 10/28/2015 Document Revised: 06/20/2016 Document Reviewed: 08/02/2015 Elsevier Interactive Patient Education  2017 [employer].  Fall Prevention in the Home Falls can cause injuries. They can happen to people of all ages. There are many things you can do to make your home safe and to help prevent falls. What can I do on the outside of my home? Regularly fix the edges of walkways and driveways and fix any cracks. Remove anything that might make you trip as you walk through a door, such as a raised step or threshold. Trim any bushes or trees on the path to your home. Use bright outdoor lighting. Clear any walking paths of anything that might make someone trip, such as rocks or tools. Regularly check to see if handrails are loose or broken. Make sure that both sides of any steps have handrails. Any raised decks and porches should have guardrails on the edges. Have any leaves, snow, or ice cleared regularly. Use sand or salt on walking paths during winter. Clean up any spills in your garage right away. This includes oil or grease spills. What can I do in the bathroom? Use night lights. Install grab bars by the toilet and in the tub and shower. Do not use towel bars as grab bars. Use non-skid mats or decals in the tub or shower. If you need to sit down in the shower, use a plastic, non-slip stool. Keep the floor dry. Clean up any water that spills on the floor as soon as it happens. Remove soap buildup in the tub or shower regularly. Attach bath mats securely with double-sided non-slip rug tape. Do not have throw rugs and other things on the floor that can make you trip. What can I do in the bedroom? Use night lights. Make sure  that you have a light by your bed that is easy to reach. Do not use any sheets or blankets that are too big for your bed. They should not hang down onto the floor. Have a firm chair that has side arms. You can use this for support while you get dressed. Do not have throw rugs and other things on the floor that can make you trip. What can I do in the kitchen? Clean up any spills right away. Avoid walking on wet floors. Keep items that you use a lot in easy-to-reach places. If you need to reach something above you, use a strong step stool that has a grab bar. Keep electrical cords out of the way. Do not use floor polish or wax that makes floors slippery. If you must use wax, use non-skid floor wax. Do not have throw rugs and other things on the floor that can make you trip. What can I do with my stairs? Do not leave any items on the stairs. Make sure that there are handrails on both sides of the stairs and use them. Fix handrails that are broken or loose. Make sure that handrails are as long as the  stairways. Check any carpeting to make sure that it is firmly attached to the stairs. Fix any carpet that is loose or worn. Avoid having throw rugs at the top or bottom of the stairs. If you do have throw rugs, attach them to the floor with carpet tape. Make sure that you have a light switch at the top of the stairs and the bottom of the stairs. If you do not have them, ask someone to add them for you. What else can I do to help prevent falls? Wear shoes that: Do not have high heels. Have rubber bottoms. Are comfortable and fit you well. Are closed at the toe. Do not wear sandals. If you use a stepladder: Make sure that it is fully opened. Do not climb a closed stepladder. Make sure that both sides of the stepladder are locked into place. Ask someone to hold it for you, if possible. Clearly mark and make sure that you can see: Any grab bars or handrails. First and last steps. Where the edge of  each step is. Use tools that help you move around (mobility aids) if they are needed. These include: Canes. Walkers. Scooters. Crutches. Turn on the lights when you go into a dark area. Replace any light bulbs as soon as they burn out. Set up your furniture so you have a clear path. Avoid moving your furniture around. If any of your floors are uneven, fix them. If there are any pets around you, be aware of where they are. Review your medicines with your doctor. Some medicines can make you feel dizzy. This can increase your chance of falling. Ask your doctor what other things that you can do to help prevent falls. This information is not intended to replace advice given to you by your health care provider. Make sure you discuss any questions you have with your health care provider. Document Released: 07/28/2009 Document Revised: 03/08/2016 Document Reviewed: 11/05/2014 Elsevier Interactive Patient Education  2017 [employer].

## 2023-07-08 NOTE — Addendum Note (Signed)
Addended by: Jacklynn Bue on: 07/08/2023 04:17 PM   Modules accepted: Orders

## 2023-07-08 NOTE — Progress Notes (Signed)
Subjective:   Bridget Jones is a 67 y.o. female who presents for Medicare Annual (Subsequent) preventive examination.  This wellness visit is conducted by a nurse.  The patient's medications were reviewed and reconciled since the patient's last visit.  History details were provided by the patient.  The history appears to be reliable.    Medical History: Patient history and Family history was reviewed  Medications, Allergies, and preventative health maintenance was reviewed and updated.   Visit Complete: In person  Patient Medicare AWV questionnaire was completed by the patient on 07/08/23; I have confirmed that all information answered by patient is correct and no changes since this date.  Cardiac Risk Factors include: advanced age (>25men, >31 women);obesity (BMI >30kg/m2)     Objective:    Today's Vitals   07/08/23 1453  BP: 122/78  Pulse: 68  Resp: 16  Weight: 215 lb (97.5 kg)  Height: 5\' 5"  (1.651 m)  PainSc: 0-No pain   Body mass index is 35.78 kg/m.     06/20/2022    9:18 AM  Advanced Directives  Does Patient Have a Medical Advance Directive? No  Would patient like information on creating a medical advance directive? Yes (MAU/Ambulatory/Procedural Areas - Information given)    Current Medications (verified) Outpatient Encounter Medications as of 07/08/2023  Medication Sig   calcium carbonate (OSCAL) 1500 (600 Ca) MG TABS tablet Take 1,500 mg by mouth 2 (two) times daily with a meal.   Cholecalciferol (D3 PO) Take 1 tablet by mouth daily.   Coenzyme Q10 (CO Q 10 PO) Take 1 tablet by mouth daily.   KRILL OIL PO Take 1 tablet by mouth daily.   Magnesium Citrate 200 MG TABS Take 1 tablet by mouth daily.   Multiple Vitamin (MULTIVITAMIN) tablet Take 1 tablet by mouth daily.   psyllium (REGULOID) 0.52 g capsule Take 0.52 g by mouth daily.   SYNTHROID 125 MCG tablet TAKE 1 TABLET (125 MCG TOTAL) DAILY BEFORE BREAKFAST (Patient taking differently: Take 125 mcg by mouth daily  before breakfast.)   Turmeric (QC TUMERIC COMPLEX PO) Take 1 tablet by mouth 2 (two) times daily at 10 AM and 5 PM.   rosuvastatin (CRESTOR) 10 MG tablet Take 1 tablet (10 mg total) by mouth daily. (Patient not taking: Reported on 07/08/2023)   No facility-administered encounter medications on file as of 07/08/2023.    Allergies (verified) Atorvastatin   History: Past Medical History:  Diagnosis Date   Graves disease    Hyperlipidemia    Hypertension    No past surgical history on file. Family History  Problem Relation Age of Onset   Hypertension Mother    Diabetes Father    Stroke Father    COPD Father    Hyperthyroidism Sister    Hypothyroidism Sister    Breast cancer Neg Hx    Social History   Socioeconomic History   Marital status: Married    Spouse name: Bridget Jones   Number of children: 2   Years of education: Not on file   Highest education level: 12th grade  Occupational History   Occupation: Presenter, broadcasting    Comment: Retired  Tobacco Use   Smoking status: Never    Passive exposure: Past   Smokeless tobacco: Never  Vaping Use   Vaping status: Never Used  Substance and Sexual Activity   Alcohol use: Yes    Comment: rarely ( one drink per month)   Drug use: Never   Sexual activity: Yes  Other Topics Concern   Not on file  Social History Narrative   Not on file   Social Determinants of Health   Financial Resource Strain: Low Risk  (07/08/2023)   Overall Financial Resource Strain (CARDIA)    Difficulty of Paying Living Expenses: Not hard at all  Food Insecurity: No Food Insecurity (07/08/2023)   Hunger Vital Sign    Worried About Running Out of Food in the Last Year: Never true    Ran Out of Food in the Last Year: Never true  Transportation Needs: No Transportation Needs (07/08/2023)   PRAPARE - Administrator, Civil Service (Medical): No    Lack of Transportation (Non-Medical): No  Physical Activity: Sufficiently Active (07/08/2023)    Exercise Vital Sign    Days of Exercise per Week: 5 days    Minutes of Exercise per Session: 30 min  Stress: No Stress Concern Present (07/08/2023)   [employer] of Occupational Health - Occupational Stress Questionnaire    Feeling of Stress : Not at all  Social Connections: Socially Integrated (07/08/2023)   Social Connection and Isolation Panel [NHANES]    Frequency of Communication with Friends and Family: More than three times a week    Frequency of Social Gatherings with Friends and Family: Three times a week    Attends Religious Services: More than 4 times per year    Active Member of Clubs or Organizations: Yes    Attends Banker Meetings: 1 to 4 times per year    Marital Status: Married    Tobacco Counseling Counseling given: Not Answered   Clinical Intake: Pre-visit preparation completed: No Pain : No/denies pain Pain Score: 0-No pain    BMI - recorded: 35.78 Nutritional Status: BMI > 30  Obese Nutritional Risks: None Diabetes: No How often do you need to have someone help you when you read instructions, pamphlets, or other written materials from your doctor or pharmacy?: 1 - Never Interpreter Needed?: No     Activities of Daily Living    07/08/2023    9:15 AM  In your present state of health, do you have any difficulty performing the following activities:  Hearing? 0  Vision? 0  Difficulty concentrating or making decisions? 0  Walking or climbing stairs? 0  Dressing or bathing? 0  Doing errands, shopping? 0  Preparing Food and eating ? N  Using the Toilet? N  In the past six months, have you accidently leaked urine? N  Do you have problems with loss of bowel control? N  Managing your Medications? N  Managing your Finances? N  Housekeeping or managing your Housekeeping? N    Patient Care Team: Blane Ohara, MD as PCP - General (Family Medicine) Georgeanna Lea, MD as Consulting Physician (Cardiology)    Assessment:   This is a  routine wellness examination for Bridget Jones.  Hearing/Vision screen No results found.  Depression Screen    07/08/2023    2:55 PM 04/11/2023    8:44 AM 12/20/2022   10:47 AM 06/20/2022    9:04 AM 09/19/2021    8:59 AM 06/21/2020   12:17 AM  PHQ 2/9 Scores  PHQ - 2 Score 0 0 0 0 0 0  PHQ- 9 Score  0        Fall Risk    07/08/2023    9:15 AM 04/11/2023    8:44 AM 12/20/2022   10:47 AM 06/20/2022    9:18 AM 09/19/2021    8:59 AM  Fall Risk   Falls in the past year? 0 0 0 0 0  Number falls in past yr: 0 0 0 0 0  Injury with Fall? 0 0 0 0 0  Risk for fall due to : No Fall Risks No Fall Risks No Fall Risks No Fall Risks No Fall Risks  Follow up Falls evaluation completed;Education provided Falls evaluation completed Falls evaluation completed Falls evaluation completed Falls evaluation completed    MEDICARE RISK AT HOME: Medicare Risk at Home Any stairs in or around the home?: Yes If so, are there any without handrails?: No Home free of loose throw rugs in walkways, pet beds, electrical cords, etc?: No Adequate lighting in your home to reduce risk of falls?: Yes Life alert?: No Use of a cane, walker or w/c?: No Grab bars in the bathroom?: No Shower chair or bench in shower?: No Elevated toilet seat or a handicapped toilet?: No  TIMED UP AND GO:  Was the test performed?  Yes  Length of time to ambulate 10 feet: 4 sec Gait steady and fast without use of assistive device    Cognitive Function:        07/08/2023    3:16 PM 06/20/2022    9:22 AM  6CIT Screen  What Year? 0 points 0 points  What month? 0 points 0 points  What time? 0 points 0 points  Count back from 20 0 points 0 points  Months in reverse 0 points 0 points  Repeat phrase 0 points 0 points  Total Score 0 points 0 points    Immunizations Immunization History  Administered Date(s) Administered   PFIZER(Purple Top)SARS-COV-2 Vaccination 01/11/2020, 02/01/2020   PNEUMOCOCCAL CONJUGATE-20 06/20/2022   Tdap 05/25/2019    Zoster Recombinant(Shingrix) 11/16/2020, 01/16/2021    TDAP status: Up to date  Flu Vaccine status: Declined, Education has been provided regarding the importance of this vaccine but patient still declined. Advised may receive this vaccine at local pharmacy or Health Dept. Aware to provide a copy of the vaccination record if obtained from local pharmacy or Health Dept. Verbalized acceptance and understanding.  Pneumococcal vaccine status: Up to date  Covid-19 vaccine status: Declined, Education has been provided regarding the importance of this vaccine but patient still declined. Advised may receive this vaccine at local pharmacy or Health Dept.or vaccine clinic. Aware to provide a copy of the vaccination record if obtained from local pharmacy or Health Dept. Verbalized acceptance and understanding.  Qualifies for Shingles Vaccine? Yes   Zostavax completed No   Shingrix Completed?: Yes  Screening Tests Health Maintenance  Topic Date Due   COVID-19 Vaccine (3 - 2023-24 season) 06/16/2023   Medicare Annual Wellness (AWV)  06/21/2023   INFLUENZA VACCINE  01/13/2024 (Originally 05/16/2023)   MAMMOGRAM  10/11/2024   Fecal DNA (Cologuard)  11/14/2025   DTaP/Tdap/Td (2 - Td or Tdap) 05/24/2029   Pneumonia Vaccine 36+ Years old  Completed   DEXA SCAN  Completed   Hepatitis C Screening  Completed   Zoster Vaccines- Shingrix  Completed   HPV VACCINES  Aged Out    Health Maintenance  Health Maintenance Due  Topic Date Due   COVID-19 Vaccine (3 - 2023-24 season) 06/16/2023   Medicare Annual Wellness (AWV)  06/21/2023    Colorectal cancer screening: Type of screening: Cologuard. Completed 2024. Repeat every 3 years  Mammogram status: Scheduled  Bone Density status: Completed 2023. Results reflect: Bone density results: OSTEOPENIA. Repeat every 2 years.  Lung Cancer Screening: (Low Dose  CT Chest recommended if Age 46-80 years, 20 pack-year currently smoking OR have quit w/in 15years.)  does not qualify.   Additional Screening:  Vision Screening: Recommended annual ophthalmology exams for early detection of glaucoma and other disorders of the eye. Is the patient up to date with their annual eye exam?  Yes  Who is the provider or what is the name of the office in which the patient attends annual eye exams? Dr Precious Bard  Dental Screening: Recommended annual dental exams for proper oral hygiene   Community Resource Referral / Chronic Care Management: CRR required this visit?  No   CCM required this visit?  No     Plan:     I have personally reviewed and noted the following in the patient's chart:   Medical and social history Use of alcohol, tobacco or illicit drugs  Current medications and supplements including opioid prescriptions. Patient is not currently taking opioid prescriptions. Functional ability and status Nutritional status Physical activity Advanced directives List of other physicians Hospitalizations, surgeries, and ER visits in previous 12 months Vitals Screenings to include cognitive, depression, and falls Referrals and appointments  In addition, I have reviewed and discussed with patient certain preventive protocols, quality metrics, and best practice recommendations. A written personalized care plan for preventive services as well as general preventive health recommendations were provided to patient.     Jacklynn Bue, LPN   12/13/6008

## 2023-07-18 DIAGNOSIS — H5213 Myopia, bilateral: Secondary | ICD-10-CM | POA: Diagnosis not present

## 2023-07-30 NOTE — Progress Notes (Unsigned)
Subjective:  Patient ID: Bridget Jones, female    DOB: 07-07-1956  Age: 67 y.o. MRN: [id number]  Chief Complaint  Patient presents with   Medical Management of Chronic Issues    HPI Patient has a history of hyperlipidemia/abnormal coronary artery calcium score  with LDL 153.  Intolerant to statins.    Acquired hypothyroidism: Currently on Synthroid 125 mcg daily.  Patient is seeing  pulmonology due to abnormal ct scan of chest. They have not determined what is going on her lungs. She is asymptomatic.   Obesity: trying to eat healthy and exercise.        08/01/2023    8:25 AM 07/08/2023    2:55 PM 04/11/2023    8:44 AM 12/20/2022   10:47 AM 06/20/2022    9:04 AM  Depression screen PHQ 2/9  Decreased Interest 0 0 0 0 0  Down, Depressed, Hopeless 0 0 0 0 0  PHQ - 2 Score 0 0 0 0 0  Altered sleeping   0    Tired, decreased energy   0    Change in appetite   0    Feeling bad or failure about yourself    0    Trouble concentrating   0    Moving slowly or fidgety/restless   0    Suicidal thoughts   0    PHQ-9 Score   0    Difficult doing work/chores   Not difficult at all          07/08/2023    9:15 AM  Fall Risk   Falls in the past year? 0  Number falls in past yr: 0  Injury with Fall? 0  Risk for fall due to : No Fall Risks  Follow up Falls evaluation completed;Education provided    Patient Care Team: Blane Ohara, MD as PCP - General (Family Medicine) Georgeanna Lea, MD as Consulting Physician (Cardiology)   Review of Systems  Constitutional:  Negative for chills, fatigue and fever.  HENT:  Negative for congestion, ear pain, rhinorrhea and sore throat.   Respiratory:  Negative for cough and shortness of breath.   Cardiovascular:  Negative for chest pain.  Gastrointestinal:  Negative for abdominal pain, constipation, diarrhea, nausea and vomiting.  Genitourinary:  Negative for dysuria and urgency.  Musculoskeletal:  Negative for back pain and myalgias.   Neurological:  Negative for dizziness, weakness, light-headedness and headaches.  Psychiatric/Behavioral:  Negative for dysphoric mood. The patient is not nervous/anxious.     Current Outpatient Medications on File Prior to Visit  Medication Sig Dispense Refill   calcium carbonate (OSCAL) 1500 (600 Ca) MG TABS tablet Take 1,500 mg by mouth 2 (two) times daily with a meal.     Coenzyme Q10 (CO Q 10 PO) Take 1 tablet by mouth daily.     KRILL OIL PO Take 1 tablet by mouth daily.     Magnesium Citrate 200 MG TABS Take 1 tablet by mouth daily.     Multiple Vitamin (MULTIVITAMIN) tablet Take 1 tablet by mouth daily.     psyllium (REGULOID) 0.52 g capsule Take 0.52 g by mouth daily.     SYNTHROID 125 MCG tablet TAKE 1 TABLET (125 MCG TOTAL) DAILY BEFORE BREAKFAST (Patient taking differently: Take 125 mcg by mouth daily before breakfast.) 90 tablet 3   Turmeric (QC TUMERIC COMPLEX PO) Take 1 tablet by mouth 2 (two) times daily at 10 AM and 5 PM.     No current  facility-administered medications on file prior to visit.   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 No pertinent surgical history.  Family History  Problem Relation Age of Onset   Hypertension Mother    Diabetes Father    Stroke Father    COPD Father    Hyperthyroidism Sister    Hypothyroidism Sister    Breast cancer Neg Hx    Social History   Socioeconomic History   Marital status: Married    Spouse name: Fredrik Cove   Number of children: 2   Years of education: Not on file   Highest education level: 12th grade  Occupational History   Occupation: Presenter, broadcasting    Comment: Retired  Tobacco Use   Smoking status: Never    Passive exposure: Past   Smokeless tobacco: Never  Vaping Use   Vaping status: Never Used  Substance and Sexual  Activity   Alcohol use: Yes    Comment: rarely ( one drink per month)   Drug use: Never   Sexual activity: Yes  Other Topics Concern   Not on file  Social History Narrative   Not on file   Social Determinants of Health   Financial Resource Strain: Low Risk  (07/31/2023)   Overall Financial Resource Strain (CARDIA)    Difficulty of Paying Living Expenses: Not hard at all  Food Insecurity: No Food Insecurity (07/31/2023)   Hunger Vital Sign    Worried About Running Out of Food in the Last Year: Never true    Ran Out of Food in the Last Year: Never true  Transportation Needs: No Transportation Needs (07/31/2023)   PRAPARE - Administrator, Civil Service (Medical): No    Lack of Transportation (Non-Medical): No  Physical Activity: Sufficiently Active (07/31/2023)   Exercise Vital Sign    Days of Exercise per Week: 6 days    Minutes of Exercise per Session: 30 min  Stress: No Stress Concern Present (07/31/2023)   [employer] of Occupational Health - Occupational Stress Questionnaire    Feeling of Stress : Not at all  Social Connections: Socially Integrated (07/31/2023)   Social Connection and Isolation Panel [NHANES]    Frequency of Communication with Friends and Family: More than three times a week    Frequency of Social Gatherings with Friends and Family: Three times a week    Attends Religious Services: More than 4 times per year    Active Member of Clubs or Organizations: Yes    Attends Banker Meetings: More than 4 times per year    Marital Status: Married    Objective:  BP 138/88   Pulse 68   Temp (!) 97.2 F (36.2 C) (Temporal)   Ht 5\' 5"  (1.651 m)   Wt 213 lb 3.2 oz (96.7 kg)   SpO2 98%   BMI 35.48 kg/m      08/01/2023    9:20 AM 08/01/2023    8:22 AM 07/08/2023    2:53 PM  BP/Weight  Systolic BP 138 140 122  Diastolic BP 88 100 78  Wt. (Lbs)  213.2 215  BMI  35.48 kg/m2 35.78 kg/m2    Physical Exam Vitals reviewed.   Constitutional:  Appearance: Normal appearance. She is normal weight.  Neck:     Vascular: No carotid bruit.  Cardiovascular:     Rate and Rhythm: Normal rate and regular rhythm.     Heart sounds: Normal heart sounds.  Pulmonary:     Effort: Pulmonary effort is normal. No respiratory distress.     Breath sounds: Normal breath sounds.  Abdominal:     General: Abdomen is flat. Bowel sounds are normal.     Palpations: Abdomen is soft.     Tenderness: There is no abdominal tenderness.  Neurological:     Mental Status: She is alert and oriented to person, place, and time.  Psychiatric:        Mood and Affect: Mood normal.        Behavior: Behavior normal.     Diabetic Foot Exam - Simple   No data filed      Lab Results  Component Value Date   WBC 5.6 08/01/2023   HGB 16.8 (H) 08/01/2023   HCT 50.3 (H) 08/01/2023   PLT 242 08/01/2023   GLUCOSE 99 08/01/2023   CHOL 228 (H) 08/01/2023   TRIG 191 (H) 08/01/2023   HDL 41 08/01/2023   LDLCALC 152 (H) 08/01/2023   ALT 17 08/01/2023   AST 22 08/01/2023   NA 142 08/01/2023   K 4.7 08/01/2023   CL 104 08/01/2023   CREATININE 0.80 08/01/2023   BUN 13 08/01/2023   CO2 23 08/01/2023   TSH 2.790 04/11/2023   HGBA1C 5.1 06/20/2022      Assessment & Plan:    Postablative hypothyroidism Assessment & Plan: Previously well controlled Continue Synthroid at current dose     Mixed hyperlipidemia Assessment & Plan: Poorly controlled.  Intolerant to statins. Start Zetia 10 mg daily and continue with CoQ10. Continue to work on eating a healthy diet and exercise.  Labs drawn today.    Orders: -     Lipid panel -     Ezetimibe; Take 1 tablet (10 mg total) by mouth daily.  Dispense: 90 tablet; Refill: 0  Essential hypertension Assessment & Plan: Not at goal. Start hydrochlorothiazide 25 mg daily Continue to work on eating a healthy diet and exercise.  Labs drawn today.    Orders: -     CBC with  Differential/Platelet -     Comprehensive metabolic panel -     hydroCHLOROthiazide; Take 1 tablet (25 mg total) by mouth daily.  Dispense: 90 tablet; Refill: 1  Agatston coronary artery calcium score less than 100 Assessment & Plan: Start Zetia 10 mg daily.  Recommend baby aspirin once a day   Class 2 severe obesity due to excess calories with serious comorbidity and body mass index (BMI) of 35.0 to 35.9 in adult [facility]) Assessment & Plan: Recommend healthy diet and exercise. Comorbidities: Hypertension and hyperlipidemia.   Abnormal CT scan, chest Assessment & Plan: Follows with pulmonology.  Despite abnormal findings on CT scan she continues to be asymptomatic.   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  Although I do not have the records I believe the patient has been seen by hematology.  I will have my nurse to inquire when she calls about the labs.   Myalgia due to statin Assessment & Plan: Intolerant to statins despite even trying them with coenzyme every 10      Meds ordered this encounter  Medications   ezetimibe (ZETIA) 10 MG tablet    Sig: Take 1 tablet (  10 mg total) by mouth daily.    Dispense:  90 tablet    Refill:  0   hydrochlorothiazide (HYDRODIURIL) 25 MG tablet    Sig: Take 1 tablet (25 mg total) by mouth daily.    Dispense:  90 tablet    Refill:  1    Orders Placed This Encounter  Procedures   CBC with Differential/Platelet   Comprehensive metabolic panel   Lipid panel     Follow-up: Return in about 3 months (around 11/01/2023) for fasting.   I,Katherina A Bramblett,acting as a scribe for Blane Ohara, MD.,have documented all relevant documentation on the behalf of Blane Ohara, MD,as directed by  Blane Ohara, MD while in the presence of Blane Ohara, MD.   Clayborn Bigness I Leal-Borjas,acting as a scribe for Blane Ohara, MD.,have documented all relevant documentation on the behalf of Blane Ohara, MD,as directed by  Blane Ohara, MD while in the presence of Blane Ohara, MD.    An After Visit Summary was printed and given to the patient.  I attest that I have reviewed this visit and agree with the plan scribed by my staff.   Blane Ohara, MD [facility] [phone]

## 2023-08-01 ENCOUNTER — Encounter: Payer: Self-pay | Admitting: Family Medicine

## 2023-08-01 ENCOUNTER — Ambulatory Visit: Payer: Medicare Other | Admitting: Family Medicine

## 2023-08-01 VITALS — BP 138/88 | HR 68 | Temp 97.2°F | Ht 65.0 in | Wt 213.2 lb

## 2023-08-01 DIAGNOSIS — T466X5A Adverse effect of antihyperlipidemic and antiarteriosclerotic drugs, initial encounter: Secondary | ICD-10-CM

## 2023-08-01 DIAGNOSIS — Z6835 Body mass index (BMI) 35.0-35.9, adult: Secondary | ICD-10-CM

## 2023-08-01 DIAGNOSIS — E782 Mixed hyperlipidemia: Secondary | ICD-10-CM

## 2023-08-01 DIAGNOSIS — R931 Abnormal findings on diagnostic imaging of heart and coronary circulation: Secondary | ICD-10-CM

## 2023-08-01 DIAGNOSIS — E66812 Obesity, class 2: Secondary | ICD-10-CM

## 2023-08-01 DIAGNOSIS — D582 Other hemoglobinopathies: Secondary | ICD-10-CM

## 2023-08-01 DIAGNOSIS — I10 Essential (primary) hypertension: Secondary | ICD-10-CM | POA: Diagnosis not present

## 2023-08-01 DIAGNOSIS — M791 Myalgia, unspecified site: Secondary | ICD-10-CM

## 2023-08-01 DIAGNOSIS — E890 Postprocedural hypothyroidism: Secondary | ICD-10-CM

## 2023-08-01 DIAGNOSIS — R9389 Abnormal findings on diagnostic imaging of other specified body structures: Secondary | ICD-10-CM

## 2023-08-01 LAB — CBC WITH DIFFERENTIAL/PLATELET
Basophils Absolute: 0.1 10*3/uL (ref 0.0–0.2)
Basos: 1 %
EOS (ABSOLUTE): 0.1 10*3/uL (ref 0.0–0.4)
Eos: 1 %
Hematocrit: 50.3 % — ABNORMAL HIGH (ref 34.0–46.6)
Hemoglobin: 16.8 g/dL — ABNORMAL HIGH (ref 11.1–15.9)
Immature Grans (Abs): 0 10*3/uL (ref 0.0–0.1)
Immature Granulocytes: 0 %
Lymphocytes Absolute: 1.3 10*3/uL (ref 0.7–3.1)
Lymphs: 24 %
MCH: 30.7 pg (ref 26.6–33.0)
MCHC: 33.4 g/dL (ref 31.5–35.7)
MCV: 92 fL (ref 79–97)
Monocytes Absolute: 0.5 10*3/uL (ref 0.1–0.9)
Monocytes: 8 %
Neutrophils Absolute: 3.6 10*3/uL (ref 1.4–7.0)
Neutrophils: 66 %
Platelets: 242 10*3/uL (ref 150–450)
RBC: 5.47 x10E6/uL — ABNORMAL HIGH (ref 3.77–5.28)
RDW: 12.3 % (ref 11.7–15.4)
WBC: 5.6 10*3/uL (ref 3.4–10.8)

## 2023-08-01 LAB — LIPID PANEL
Chol/HDL Ratio: 5.6 {ratio} — ABNORMAL HIGH (ref 0.0–4.4)
Cholesterol, Total: 228 mg/dL — ABNORMAL HIGH (ref 100–199)
HDL: 41 mg/dL (ref >39)
LDL Chol Calc (NIH): 152 mg/dL — ABNORMAL HIGH (ref 0–99)
Triglycerides: 191 mg/dL — ABNORMAL HIGH (ref 0–149)
VLDL Cholesterol Cal: 35 mg/dL (ref 5–40)

## 2023-08-01 LAB — COMPREHENSIVE METABOLIC PANEL
ALT: 17 [IU]/L (ref 0–32)
AST: 22 [IU]/L (ref 0–40)
Albumin: 4.4 g/dL (ref 3.9–4.9)
Alkaline Phosphatase: 72 [IU]/L (ref 44–121)
BUN/Creatinine Ratio: 16 (ref 12–28)
BUN: 13 mg/dL (ref 8–27)
Bilirubin Total: 1 mg/dL (ref 0.0–1.2)
CO2: 23 mmol/L (ref 20–29)
Calcium: 9.4 mg/dL (ref 8.7–10.3)
Chloride: 104 mmol/L (ref 96–106)
Creatinine, Ser: 0.8 mg/dL (ref 0.57–1.00)
Globulin, Total: 2.6 g/dL (ref 1.5–4.5)
Glucose: 99 mg/dL (ref 70–99)
Potassium: 4.7 mmol/L (ref 3.5–5.2)
Sodium: 142 mmol/L (ref 134–144)
Total Protein: 7 g/dL (ref 6.0–8.5)
eGFR: 81 mL/min/{1.73_m2} (ref >59)

## 2023-08-01 MED ORDER — HYDROCHLOROTHIAZIDE 25 MG PO TABS
25.0000 mg | ORAL_TABLET | Freq: Every day | ORAL | 1 refills | Status: AC
Start: 2023-08-01 — End: ?

## 2023-08-01 MED ORDER — EZETIMIBE 10 MG PO TABS
10.0000 mg | ORAL_TABLET | Freq: Every day | ORAL | 0 refills | Status: DC
Start: 2023-08-01 — End: 2023-10-27

## 2023-08-03 NOTE — Assessment & Plan Note (Signed)
Well controlled.  Start Zetia 10 mg daily and continue with CoQ10. Continue to work on eating a healthy diet and exercise.  Labs drawn today.

## 2023-08-03 NOTE — Assessment & Plan Note (Signed)
Previously well controlled Continue Synthroid at current dose  

## 2023-08-03 NOTE — Assessment & Plan Note (Signed)
Well controlled.  Start hydrochlorothiazide 25 mg daily Continue to work on eating a healthy diet and exercise.  Labs drawn today.

## 2023-08-04 ENCOUNTER — Encounter: Payer: Self-pay | Admitting: Family Medicine

## 2023-08-04 DIAGNOSIS — D582 Other hemoglobinopathies: Secondary | ICD-10-CM | POA: Insufficient documentation

## 2023-08-04 DIAGNOSIS — R9389 Abnormal findings on diagnostic imaging of other specified body structures: Secondary | ICD-10-CM | POA: Insufficient documentation

## 2023-08-04 NOTE — Assessment & Plan Note (Signed)
Recommend healthy diet and exercise. Comorbidities: Hypertension and hyperlipidemia.

## 2023-08-04 NOTE — Assessment & Plan Note (Signed)
Follows with pulmonology.  Despite abnormal findings on CT scan she continues to be asymptomatic.

## 2023-08-04 NOTE — Assessment & Plan Note (Signed)
This is a chronic issue.  Although I do not have the records I believe the patient has been seen by hematology.  I will have my nurse to inquire when she calls about the labs.

## 2023-08-04 NOTE — Assessment & Plan Note (Signed)
Start Zetia 10 mg daily.  Recommend baby aspirin once a day

## 2023-08-04 NOTE — Assessment & Plan Note (Signed)
Intolerant to statins despite even trying them with coenzyme every 10

## 2023-08-07 ENCOUNTER — Encounter: Payer: Self-pay | Admitting: Internal Medicine

## 2023-08-12 NOTE — Telephone Encounter (Signed)
Per LOV: Return in about 6 months (around 08/17/2023) for next available PFT, follow up after in 6 months.   Patient needs OV with Celine Mans. Thank you!

## 2023-08-19 ENCOUNTER — Ambulatory Visit: Payer: Medicare Other | Admitting: Internal Medicine

## 2023-08-19 ENCOUNTER — Ambulatory Visit (INDEPENDENT_AMBULATORY_CARE_PROVIDER_SITE_OTHER): Payer: Medicare Other | Admitting: Internal Medicine

## 2023-08-19 ENCOUNTER — Encounter: Payer: Self-pay | Admitting: Internal Medicine

## 2023-08-19 VITALS — BP 134/87 | HR 69 | Ht 65.0 in | Wt 212.0 lb

## 2023-08-19 DIAGNOSIS — K219 Gastro-esophageal reflux disease without esophagitis: Secondary | ICD-10-CM | POA: Diagnosis not present

## 2023-08-19 DIAGNOSIS — J301 Allergic rhinitis due to pollen: Secondary | ICD-10-CM | POA: Diagnosis not present

## 2023-08-19 DIAGNOSIS — R9389 Abnormal findings on diagnostic imaging of other specified body structures: Secondary | ICD-10-CM

## 2023-08-19 LAB — PULMONARY FUNCTION TEST
DL/VA % pred: 121 %
DL/VA: 5.02 ml/min/mmHg/L
DLCO cor % pred: 100 %
DLCO cor: 20.61 ml/min/mmHg
DLCO unc % pred: 110 %
DLCO unc: 22.48 ml/min/mmHg
FEF 25-75 Post: 2.71 L/s
FEF 25-75 Pre: 2.84 L/s
FEF2575-%Change-Post: -4 %
FEF2575-%Pred-Post: 126 %
FEF2575-%Pred-Pre: 132 %
FEV1-%Change-Post: -1 %
FEV1-%Pred-Post: 90 %
FEV1-%Pred-Pre: 92 %
FEV1-Post: 2.26 L
FEV1-Pre: 2.29 L
FEV1FVC-%Change-Post: 3 %
FEV1FVC-%Pred-Pre: 109 %
FEV6-%Change-Post: -4 %
FEV6-%Pred-Post: 82 %
FEV6-%Pred-Pre: 87 %
FEV6-Post: 2.59 L
FEV6-Pre: 2.72 L
FEV6FVC-%Pred-Post: 104 %
FEV6FVC-%Pred-Pre: 104 %
FVC-%Change-Post: -4 %
FVC-%Pred-Post: 79 %
FVC-%Pred-Pre: 83 %
FVC-Post: 2.59 L
FVC-Pre: 2.72 L
Post FEV1/FVC ratio: 87 %
Post FEV6/FVC ratio: 100 %
Pre FEV1/FVC ratio: 84 %
Pre FEV6/FVC Ratio: 100 %
RV % pred: 67 %
RV: 1.46 L
TLC % pred: 80 %
TLC: 4.2 L

## 2023-08-19 NOTE — Patient Instructions (Addendum)
It was a pleasure to see you today!  Please schedule follow up scheduled with myself in 1 year.  If my schedule is not open yet, we will contact you with a reminder closer to that time. Please call [phone] if you haven't heard from [place] a month before, and always call us sooner if issues or concerns arise. You can also send [place] a message through MyChart, but but aware that this is not to be used for urgent issues and it may take up to 5-7 days to receive a reply. Please be aware that you will likely be able to view your results before I have a chance to respond to them. Please give [place] 5 business days to respond to any non-urgent results.    Before your next visit I would like you to have: CT scan of your chest Pulmonary function testing - to be done prior to your next appointment with me.   The breathing testing today was completely normal. We will keep an eye on this annually.  I am repeating a scan of your lungs now to ensure stability or resolution of the changes we saw in May. Please contact me sooner than one year if you are developing any respiratory symptoms - I would want to see you sooner.

## 2023-08-19 NOTE — Progress Notes (Signed)
Full PFT performed today. °

## 2023-08-19 NOTE — Patient Instructions (Signed)
Full PFT performed today. °

## 2023-08-19 NOTE — Progress Notes (Signed)
Bridget Jones    [id number]    10/14/1956  Primary Care Physician:[facility], Fritzi Mandes, MD Date of Appointment: 08/19/2023 Established Patient Visit  Chief complaint:   Chief Complaint  Patient presents with   Follow-up    PFT 08/19/23 CT 02/11/23     HPI: Bridget Jones is a 67 y.o. woman with past medical history of seasonal allergic rhinitis. Initially presented for incidental findings on CT coronary calcium  Interval Updates: Here for follow up after PFTS which shows normal pulmonary function. Reflux has resolved.  Has some very infrequent coughing related to allergic rhinitis No dyspnea, chest tightness wheezing Feels gerd has improved.    I have reviewed the patient's family social and past medical history and updated as appropriate.   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 No pertinent surgical history.  Family History  Problem Relation Age of Onset   Hypertension Mother    Diabetes Father    Stroke Father    COPD Father    Hyperthyroidism Sister    Hypothyroidism Sister    Breast cancer Neg Hx     Social History   Occupational History   Occupation: Presenter, broadcasting    Comment: Retired  Tobacco Use   Smoking status: Never    Passive exposure: Past   Smokeless tobacco: Never  Vaping Use   Vaping status: Never Used  Substance and Sexual Activity   Alcohol use: Yes    Comment: rarely ( one drink per month)   Drug use: Never   Sexual activity: Yes     Physical Exam: Blood pressure 134/87, pulse 69, height 5\' 5"  (1.651 m), weight 212 lb (96.2 kg), SpO2 98%.  Gen:      No acute distress ENT:  no nasal polyps, mucus membranes moist Lungs:    ctab, no wheeze or crackles CV:         Regular rate and rhythm; no murmurs, rubs, or gallops.  No pedal  edema MSK: no rashes  Data Reviewed: Imaging: I have personally reviewed the CT Chest 4/29 shows diffuse ground glass opacities lower lobe predominant   PFTs:      No data to display         I have personally reviewed the patient's PFTs and they show normal pulmonary function  Labs: Lab Results  Component Value Date   NA 142 08/01/2023   K 4.7 08/01/2023   CO2 23 08/01/2023   GLUCOSE 99 08/01/2023   BUN 13 08/01/2023   CREATININE 0.80 08/01/2023   CALCIUM 9.4 08/01/2023   EGFR 81 08/01/2023   GFRNONAA 72 11/11/2020     Immunization status: Immunization History  Administered Date(s) Administered   PFIZER(Purple Top)SARS-COV-2 Vaccination 01/11/2020, 02/01/2020   PNEUMOCOCCAL CONJUGATE-20 06/20/2022   Tdap 05/25/2019   Zoster Recombinant(Shingrix) 11/16/2020, 01/16/2021    External Records Personally Reviewed: cardiology   Assessment:  Abnormal CT Chest Post nasal drainage GERD  Plan/Recommendations: Will follow up with repeat CT scan of your chest. Suspect changes could be related to reflux.  Pulmonary function testing - to be done prior to your next appointment with me.   The breathing testing today was completely normal. We will keep  an eye on this annually.  I am repeating a scan of your lungs now to ensure stability or resolution of the changes we saw in May. Please contact me sooner than one year if you are developing any respiratory symptoms - I would want to see you sooner.   Return to Care: Return in about 1 year (around 08/18/2024) for follow up with PFT and same day appointment.   Durel Salts, MD Pulmonary and Critical Care Medicine [facility] Office:[phone]

## 2023-08-20 DIAGNOSIS — K080 Exfoliation of teeth due to systemic causes: Secondary | ICD-10-CM | POA: Diagnosis not present

## 2023-08-23 ENCOUNTER — Ambulatory Visit
Admission: RE | Admit: 2023-08-23 | Discharge: 2023-08-23 | Disposition: A | Discharge status: Home / Self Care | Payer: Medicare Other | Source: Ambulatory Visit | Attending: Internal Medicine

## 2023-08-23 DIAGNOSIS — I2510 Atherosclerotic heart disease of native coronary artery without angina pectoris: Secondary | ICD-10-CM | POA: Diagnosis not present

## 2023-08-23 DIAGNOSIS — I700 Atherosclerosis of aorta: Secondary | ICD-10-CM | POA: Diagnosis not present

## 2023-08-23 DIAGNOSIS — R9389 Abnormal findings on diagnostic imaging of other specified body structures: Secondary | ICD-10-CM

## 2023-08-23 DIAGNOSIS — R911 Solitary pulmonary nodule: Secondary | ICD-10-CM | POA: Diagnosis not present

## 2023-08-23 DIAGNOSIS — J849 Interstitial pulmonary disease, unspecified: Secondary | ICD-10-CM | POA: Diagnosis not present

## 2023-09-10 ENCOUNTER — Encounter: Payer: Self-pay | Admitting: Internal Medicine

## 2023-09-12 ENCOUNTER — Other Ambulatory Visit: Payer: Self-pay | Admitting: Family Medicine

## 2023-10-27 ENCOUNTER — Other Ambulatory Visit: Payer: Self-pay | Admitting: Family Medicine

## 2023-10-27 DIAGNOSIS — E782 Mixed hyperlipidemia: Secondary | ICD-10-CM

## 2023-10-30 ENCOUNTER — Ambulatory Visit
Admission: RE | Admit: 2023-10-30 | Discharge: 2023-10-30 | Disposition: A | Discharge status: Home / Self Care | Payer: Medicare Other | Source: Ambulatory Visit

## 2023-10-30 DIAGNOSIS — Z1231 Encounter for screening mammogram for malignant neoplasm of breast: Secondary | ICD-10-CM

## 2023-11-03 ENCOUNTER — Encounter: Payer: Self-pay | Admitting: Family Medicine

## 2023-11-13 NOTE — Progress Notes (Signed)
Subjective:  Patient ID: Bridget Jones, female    DOB: 07/12/1956  Age: 68 y.o. MRN: [id number]  Chief Complaint  Patient presents with   Medical Management of Chronic Issues    HPI   The patient, with a history of hyperlipidemia, hypothyroidism, and hypertension, presents for a regular follow-up. She denies any acute symptoms, including cold symptoms, cough, chest pain, abdominal pain, and general pain. She reports a recent weight loss of two pounds, which she attributes to a healthier diet and increased physical activity, although she notes that recent cold and icy weather has limited her outdoor exercise.  The patient has a history of joint pain with previous cholesterol medications, including atorvastatin, rosuvastatin, and ezetimibe. She is currently taking red yeast rice for cholesterol management, which she tolerates well. She is also on thyroid medication and hydrochlorothiazide for hypertension. She reports a recent headache, which she attributes to changes in barometric pressure, and noted a slightly elevated blood pressure during this episode.  Hypothyroidism: Currently on synthroid 125 mcg once daily in am. Last tsh therapeutic. Hyperlipidemia:  Eating heatlhy. Exercising (walking neighborhood.) Here for recheck. Taking red yeast rice.      11/14/2023    9:27 AM 08/01/2023    8:25 AM 07/08/2023    2:55 PM 04/11/2023    8:44 AM 12/20/2022   10:47 AM  Depression screen PHQ 2/9  Decreased Interest 0 0 0 0 0  Down, Depressed, Hopeless 0 0 0 0 0  PHQ - 2 Score 0 0 0 0 0  Altered sleeping 0   0   Tired, decreased energy 0   0   Change in appetite 0   0   Feeling bad or failure about yourself  0   0   Trouble concentrating 0   0   Moving slowly or fidgety/restless 0   0   Suicidal thoughts 0   0   PHQ-9 Score 0   0   Difficult doing work/chores Not difficult at all   Not difficult at all         07/08/2023    9:15 AM  Fall Risk   Falls in the past year? 0  Number falls in  past yr: 0  Injury with Fall? 0  Risk for fall due to : No Fall Risks  Follow up Falls evaluation completed;Education provided    Patient Care Team: Blane Ohara, MD as PCP - General (Family Medicine) Georgeanna Lea, MD as Consulting Physician (Cardiology)   Review of Systems  Constitutional:  Negative for chills, fatigue and fever.  HENT:  Negative for congestion, ear pain, rhinorrhea and sore throat.   Respiratory:  Negative for cough and shortness of breath.   Cardiovascular:  Negative for chest pain.  Gastrointestinal:  Negative for abdominal pain, constipation, diarrhea, nausea and vomiting.  Genitourinary:  Negative for dysuria and urgency.  Musculoskeletal:  Negative for back pain and myalgias.  Neurological:  Negative for dizziness, weakness, light-headedness and headaches.  Psychiatric/Behavioral:  Negative for dysphoric mood. The patient is not nervous/anxious.     Current Outpatient Medications on File Prior to Visit  Medication Sig Dispense Refill   calcium carbonate (OSCAL) 1500 (600 Ca) MG TABS tablet Take 1,500 mg by mouth 2 (two) times daily with a meal.     Coenzyme Q10 (CO Q 10 PO) Take 1 tablet by mouth daily.     ezetimibe (ZETIA) 10 MG tablet TAKE 1 TABLET BY MOUTH EVERY DAY 90 tablet 0  hydrochlorothiazide (HYDRODIURIL) 25 MG tablet Take 1 tablet (25 mg total) by mouth daily. 90 tablet 1   KRILL OIL PO Take 1 tablet by mouth daily.     Magnesium Citrate 200 MG TABS Take 1 tablet by mouth daily.     Multiple Vitamin (MULTIVITAMIN) tablet Take 1 tablet by mouth daily.     psyllium (REGULOID) 0.52 g capsule Take 0.52 g by mouth daily.     SYNTHROID 125 MCG tablet TAKE 1 TABLET (125 MCG TOTAL) DAILY BEFORE BREAKFAST 90 tablet 3   Turmeric (QC TUMERIC COMPLEX PO) Take 1 tablet by mouth 2 (two) times daily at 10 AM and 5 PM.     No current facility-administered medications on file prior to visit.   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 No pertinent surgical history.  Family History  Problem Relation Age of Onset   Hypertension Mother    Diabetes Father    Stroke Father    COPD Father    Hyperthyroidism Sister    Hypothyroidism Sister    Breast cancer Neg Hx    Social History   Socioeconomic History   Marital status: Married    Spouse name: Fredrik Cove   Number of children: 2   Years of education: Not on file   Highest education level: 12th grade  Occupational History   Occupation: Presenter, broadcasting    Comment: Retired  Tobacco Use   Smoking status: Never    Passive exposure: Past   Smokeless tobacco: Never  Vaping Use   Vaping status: Never Used  Substance and Sexual Activity   Alcohol use: Yes    Comment: rarely ( one drink per month)   Drug use: Never   Sexual activity: Yes  Other Topics Concern   Not on file  Social History Narrative   Not on file   Social Drivers of Health   Financial Resource Strain: Low Risk  (11/12/2023)   Overall Financial Resource Strain (CARDIA)    Difficulty of Paying Living Expenses: Not hard at all  Food Insecurity: No Food Insecurity (11/12/2023)   Hunger Vital Sign    Worried About Running Out of Food in the Last Year: Never true    Ran Out of Food in the Last Year: Never true  Transportation Needs: No Transportation Needs (11/12/2023)   PRAPARE - Administrator, Civil Service (Medical): No    Lack of Transportation (Non-Medical): No  Physical Activity: Insufficiently Active (11/12/2023)   Exercise Vital Sign    Days of Exercise per Week: 4 days    Minutes of Exercise per Session: 30 min  Stress: No Stress Concern Present (11/12/2023)   [employer] of Occupational Health - Occupational Stress Questionnaire    Feeling of Stress : Not at all  Social Connections: Socially Integrated  (11/12/2023)   Social Connection and Isolation Panel [NHANES]    Frequency of Communication with Friends and Family: More than three times a week    Frequency of Social Gatherings with Friends and Family: Three times a week    Attends Religious Services: More than 4 times per year    Active Member of Clubs or Organizations: Yes    Attends Banker Meetings: More  than 4 times per year    Marital Status: Married    Objective:  BP 118/72   Pulse 75   Temp (!) 97.1 F (36.2 C)   Ht 5\' 5"  (1.651 m)   Wt 210 lb (95.3 kg)   SpO2 97%   BMI 34.95 kg/m      11/14/2023    9:24 AM 08/19/2023   11:23 AM 08/19/2023   11:21 AM  BP/Weight  Systolic BP 118 134 135  Diastolic BP 72 87 91  Wt. (Lbs) 210  212  BMI 34.95 kg/m2  35.28 kg/m2    Physical Exam Vitals reviewed.  Constitutional:      Appearance: Normal appearance. She is normal weight.  Neck:     Vascular: No carotid bruit.  Cardiovascular:     Rate and Rhythm: Normal rate and regular rhythm.     Heart sounds: Normal heart sounds.  Pulmonary:     Effort: Pulmonary effort is normal. No respiratory distress.     Breath sounds: Normal breath sounds.  Abdominal:     General: Abdomen is flat. Bowel sounds are normal.     Palpations: Abdomen is soft.     Tenderness: There is no abdominal tenderness.  Neurological:     Mental Status: She is alert and oriented to person, place, and time.  Psychiatric:        Mood and Affect: Mood normal.        Behavior: Behavior normal.     Diabetic Foot Exam - Simple   No data filed      Lab Results  Component Value Date   WBC 5.6 08/01/2023   HGB 16.8 (H) 08/01/2023   HCT 50.3 (H) 08/01/2023   PLT 242 08/01/2023   GLUCOSE 98 11/14/2023   CHOL 227 (H) 11/14/2023   TRIG 99 11/14/2023   HDL 47 11/14/2023   LDLCALC 162 (H) 11/14/2023   ALT 18 11/14/2023   AST 21 11/14/2023   NA 142 11/14/2023   K 4.4 11/14/2023   CL 104 11/14/2023   CREATININE 0.88 11/14/2023   BUN  12 11/14/2023   CO2 20 11/14/2023   TSH 2.790 04/11/2023   HGBA1C 5.1 06/20/2022      Assessment & Plan:    Mixed hyperlipidemia Assessment & Plan: Poorly controlled.  Intolerant to statins. LDL elevated. Recommend change zetia to nexlizet once daily. Continue with CoQ10. Continue to work on eating a healthy diet and exercise.  Labs drawn today.    Orders: -     Lipid panel  Essential hypertension Assessment & Plan: Blood pressure controlled today. Reported isolated elevated blood pressure reading during a headache episode. -Continue hydrochlorothiazide daily.  Orders: -     Comprehensive metabolic panel  Class 1 obesity with serious comorbidity and body mass index (BMI) of 34.0 to 34.9 in adult, unspecified obesity type Assessment & Plan: Recommend continue to work on eating healthy diet and exercise. Comorbidities: hyperlipidemia. hypothyroidism   Postablative hypothyroidism Assessment & Plan: Stable on current medication. -Continue current thyroid medication.       No orders of the defined types were placed in this encounter.   Orders Placed This Encounter  Procedures   Comprehensive metabolic panel   Lipid panel     Follow-up: Return in about 3 months (around 02/12/2024) for chronic follow up.   Clayborn Bigness I Leal-Borjas,acting as a scribe for Blane Ohara, MD.,have documented all relevant documentation on the behalf of Blane Ohara, MD,as directed by  Blane Ohara, MD  while in the presence of Blane Ohara, MD.   An After Visit Summary was printed and given to the patient.  I attest that I have reviewed this visit and agree with the plan scribed by my staff.   Blane Ohara, MD [facility] [phone]

## 2023-11-14 ENCOUNTER — Ambulatory Visit (INDEPENDENT_AMBULATORY_CARE_PROVIDER_SITE_OTHER): Payer: Medicare Other | Admitting: Family Medicine

## 2023-11-14 ENCOUNTER — Encounter: Payer: Self-pay | Admitting: Family Medicine

## 2023-11-14 VITALS — BP 118/72 | HR 75 | Temp 97.1°F | Ht 65.0 in | Wt 210.0 lb

## 2023-11-14 DIAGNOSIS — E890 Postprocedural hypothyroidism: Secondary | ICD-10-CM | POA: Diagnosis not present

## 2023-11-14 DIAGNOSIS — E782 Mixed hyperlipidemia: Secondary | ICD-10-CM

## 2023-11-14 DIAGNOSIS — Z6834 Body mass index (BMI) 34.0-34.9, adult: Secondary | ICD-10-CM

## 2023-11-14 DIAGNOSIS — I10 Essential (primary) hypertension: Secondary | ICD-10-CM | POA: Diagnosis not present

## 2023-11-14 DIAGNOSIS — E66811 Obesity, class 1: Secondary | ICD-10-CM | POA: Diagnosis not present

## 2023-11-14 NOTE — Assessment & Plan Note (Addendum)
Poorly controlled.  Intolerant to statins. LDL elevated. Recommend change zetia to nexlizet once daily. Continue with CoQ10. Continue to work on eating a healthy diet and exercise.  Labs drawn today.

## 2023-11-15 LAB — COMPREHENSIVE METABOLIC PANEL
ALT: 18 [IU]/L (ref 0–32)
AST: 21 [IU]/L (ref 0–40)
Albumin: 4.4 g/dL (ref 3.9–4.9)
Alkaline Phosphatase: 59 [IU]/L (ref 44–121)
BUN/Creatinine Ratio: 14 (ref 12–28)
BUN: 12 mg/dL (ref 8–27)
Bilirubin Total: 1 mg/dL (ref 0.0–1.2)
CO2: 20 mmol/L (ref 20–29)
Calcium: 9.4 mg/dL (ref 8.7–10.3)
Chloride: 104 mmol/L (ref 96–106)
Creatinine, Ser: 0.88 mg/dL (ref 0.57–1.00)
Globulin, Total: 2.4 g/dL (ref 1.5–4.5)
Glucose: 98 mg/dL (ref 70–99)
Potassium: 4.4 mmol/L (ref 3.5–5.2)
Sodium: 142 mmol/L (ref 134–144)
Total Protein: 6.8 g/dL (ref 6.0–8.5)
eGFR: 72 mL/min/{1.73_m2} (ref >59)

## 2023-11-15 LAB — LIPID PANEL
Chol/HDL Ratio: 4.8 {ratio} — ABNORMAL HIGH (ref 0.0–4.4)
Cholesterol, Total: 227 mg/dL — ABNORMAL HIGH (ref 100–199)
HDL: 47 mg/dL (ref >39)
LDL Chol Calc (NIH): 162 mg/dL — ABNORMAL HIGH (ref 0–99)
Triglycerides: 99 mg/dL (ref 0–149)
VLDL Cholesterol Cal: 18 mg/dL (ref 5–40)

## 2023-11-17 ENCOUNTER — Encounter: Payer: Self-pay | Admitting: Family Medicine

## 2023-11-17 NOTE — Assessment & Plan Note (Signed)
Blood pressure controlled today. Reported isolated elevated blood pressure reading during a headache episode. -Continue hydrochlorothiazide daily.

## 2023-11-17 NOTE — Assessment & Plan Note (Signed)
Stable on current medication. -Continue current thyroid medication.

## 2023-11-17 NOTE — Assessment & Plan Note (Signed)
Recommend continue to work on eating healthy diet and exercise. Comorbidities: hyperlipidemia. hypothyroidism

## 2024-01-21 ENCOUNTER — Other Ambulatory Visit (HOSPITAL_BASED_OUTPATIENT_CLINIC_OR_DEPARTMENT_OTHER): Payer: Self-pay

## 2024-01-21 ENCOUNTER — Ambulatory Visit (HOSPITAL_BASED_OUTPATIENT_CLINIC_OR_DEPARTMENT_OTHER)
Admission: EM | Admit: 2024-01-21 | Discharge: 2024-01-21 | Disposition: A | Discharge status: Home / Self Care | Attending: Family Medicine | Admitting: Family Medicine

## 2024-01-21 ENCOUNTER — Encounter (HOSPITAL_BASED_OUTPATIENT_CLINIC_OR_DEPARTMENT_OTHER): Payer: Self-pay

## 2024-01-21 DIAGNOSIS — J0110 Acute frontal sinusitis, unspecified: Secondary | ICD-10-CM | POA: Diagnosis not present

## 2024-01-21 MED ORDER — AMOXICILLIN-POT CLAVULANATE 875-125 MG PO TABS
1.0000 | ORAL_TABLET | Freq: Two times a day (BID) | ORAL | 0 refills | Status: AC
  Filled 2024-01-21: qty 14, 7d supply, fill #0

## 2024-01-21 NOTE — Discharge Instructions (Signed)
 Take the antibiotics as prescribed for sinus infection. You can take sinus medication over-the-counter as needed for symptoms.  Recommend nasal spray.  Follow-up as needed

## 2024-01-21 NOTE — ED Provider Notes (Signed)
 [facility]    CSN: [id number] Arrival date & time: 01/21/24  4098      History   Chief Complaint Chief Complaint  Patient presents with   sinus    HPI Bridget Jones is a 68 y.o. female.   Patient is a 68 year old female who presents today with sinus pressure, congestion.  She has history of allergies and they have been flared up that over the last few weeks but he she has had increasing pressure on her eyes and headache.  Has been taking Allegra. No fever, chest congestion, sore throat, ear pain.      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   Qualifier: Diagnosis of   By: Everardo All MD, Cleophas Dunker     IMO SNOMED Dx Update Oct 2024      Patient Active Problem List   Diagnosis Date Noted   Elevated hemoglobin (HCC) 08/04/2023   Abnormal CT scan, chest 08/04/2023   Myalgia due to statin 04/11/2023   Acute nasopharyngitis 04/11/2023   Agatston coronary artery calcium score less than 100 02/13/2023   Abnormal CT of the chest 12/20/2022   Dyspnea on exertion 11/15/2022   Osteopenia of multiple sites 09/26/2022   Colon cancer screening 09/26/2022   Need for pneumococcal 20-valent conjugate vaccination 06/20/2022   Encounter for routine adult medical exam with abnormal findings 06/20/2022   Screen for colon cancer 06/20/2022   Class 1 obesity with serious comorbidity and body mass index (BMI) of 34.0 to 34.9 in adult 06/20/2022   Osteoporosis screening 06/20/2022   Encounter for screening for HIV 06/20/2022   Need for hepatitis C screening test 06/20/2022   Cervical cancer screening 06/20/2022   Mixed hyperlipidemia 07/10/2021   Essential hypertension 07/10/2021   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    History reviewed. No pertinent surgical history.  OB History   No obstetric history on file.      Home Medications    Prior to Admission medications   Medication Sig Start  Date End Date Taking? Authorizing Provider  amoxicillin-clavulanate (AUGMENTIN) 875-125 MG tablet Take 1 tablet by mouth every 12 (twelve) hours. 01/21/24  Yes Caleah Tortorelli A, FNP  calcium carbonate (OSCAL) 1500 (600 Ca) MG TABS tablet Take 1,500 mg by mouth 2 (two) times daily with a meal.    [provider]  Coenzyme Q10 (CO Q 10 PO) Take 1 tablet by mouth daily.    [provider]  ezetimibe (ZETIA) 10 MG tablet TAKE 1 TABLET BY MOUTH EVERY DAY 10/27/23   Cox, Kirsten, MD  hydrochlorothiazide (HYDRODIURIL) 25 MG tablet Take 1 tablet (25 mg total) by mouth daily. 08/01/23   Cox, Kirsten, MD  KRILL OIL PO Take 1 tablet by mouth daily.    [provider]  Magnesium Citrate 200 MG TABS Take 1 tablet by mouth daily. 06/16/19   [provider]  Multiple Vitamin (MULTIVITAMIN) tablet Take 1 tablet by mouth daily.    [provider]  psyllium (REGULOID) 0.52 g capsule Take 0.52 g by mouth daily.    [provider]  SYNTHROID 125 MCG tablet TAKE 1 TABLET (125 MCG TOTAL) DAILY BEFORE BREAKFAST 09/15/23   Cox, Kirsten, MD  Turmeric (QC TUMERIC COMPLEX PO) Take 1 tablet by mouth 2 (two) times daily at 10 AM and 5 PM.    [provider]    Family History Family History  Problem Relation Age of Onset   Hypertension Mother    Diabetes Father    Stroke Father    COPD Father    Hyperthyroidism Sister    Hypothyroidism Sister    Breast cancer Neg Hx     Social History Social History   Tobacco Use   Smoking status: Never    Passive exposure: Past   Smokeless tobacco: Never  Vaping Use   Vaping status: Never Used  Substance Use Topics   Alcohol use: Yes    Comment: rarely ( one drink per month)   Drug use: Never     Allergies   Atorvastatin and Crestor [rosuvastatin]   Review of Systems Review of Systems  See HPI Physical Exam Triage Vital Signs ED Triage Vitals  Encounter Vitals Group     BP 01/21/24 1010 120/80      Systolic BP Percentile --      Diastolic BP Percentile --      Pulse Rate 01/21/24 1010 69     Resp 01/21/24 1010 20     Temp 01/21/24 1010 98.1 F (36.7 C)     Temp Source 01/21/24 1010 Oral     SpO2 01/21/24 1010 95 %     Weight --      Height --      Head Circumference --      Peak Flow --      Pain Score 01/21/24 1011 5     Pain Loc --      Pain Education --      Exclude from Growth Chart --    No data found.  Updated Vital Signs BP 120/80 (BP Location: Right Arm)   Pulse 69   Temp 98.1 F (36.7 C) (Oral)   Resp 20   SpO2 95%   Visual Acuity Right Eye Distance:   Left Eye Distance:   Bilateral Distance:    Right Eye Near:   Left Eye Near:    Bilateral Near:     Physical Exam Constitutional:      General: She is not in acute distress.    Appearance: Normal appearance. She is not ill-appearing, toxic-appearing or diaphoretic.  HENT:     Head: Normocephalic and atraumatic.     Right Ear: Tympanic membrane and ear canal normal.     Left Ear: Tympanic membrane and ear canal normal.     Nose: Congestion and rhinorrhea present.     Mouth/Throat:     Pharynx: Oropharynx is clear.  Eyes:     Conjunctiva/sclera: Conjunctivae normal.  Cardiovascular:     Rate and Rhythm: Normal rate and regular rhythm.     Pulses: Normal pulses.     Heart sounds: Normal heart sounds.  Pulmonary:     Effort: Pulmonary effort is normal.     Breath sounds: Normal breath sounds.  Skin:    General: Skin is warm and dry.  Neurological:     Mental Status: She is alert.  Psychiatric:        Mood and Affect: Mood normal.      UC Treatments / Results  Labs (all labs ordered are listed, but only abnormal results are displayed) Labs Reviewed - No data to display  EKG   Radiology No results found.  Procedures Procedures (including critical care time)  Medications Ordered in UC Medications - No data to display  Initial Impression / Assessment and Plan / UC Course  I have  reviewed the triage vital signs and the  nursing notes.  Pertinent labs & imaging results that were available during my care of the patient were reviewed by me and considered in my medical decision making (see chart for details).     Sinusitis- treating with amoxicillin. Can do OTC meds for sinus symptoms as needed.  Follow up as needed  Final Clinical Impressions(s) / UC Diagnoses   Final diagnoses:  Acute non-recurrent frontal sinusitis     Discharge Instructions      Take the antibiotics as prescribed for sinus infection. You can take sinus medication over-the-counter as needed for symptoms.  Recommend nasal spray.  Follow-up as needed    ED Prescriptions     Medication Sig Dispense Auth. Provider   amoxicillin-clavulanate (AUGMENTIN) 875-125 MG tablet Take 1 tablet by mouth every 12 (twelve) hours. 14 tablet Janace Aris, FNP      PDMP not reviewed this encounter.   Janace Aris, FNP 01/21/24 1415

## 2024-01-21 NOTE — ED Triage Notes (Signed)
 States hx of environmental allergies. Reports increase in sinus congestion then pain around eyes.

## 2024-02-12 ENCOUNTER — Encounter (HOSPITAL_BASED_OUTPATIENT_CLINIC_OR_DEPARTMENT_OTHER): Payer: Self-pay | Admitting: Family Medicine

## 2024-02-12 ENCOUNTER — Other Ambulatory Visit (HOSPITAL_BASED_OUTPATIENT_CLINIC_OR_DEPARTMENT_OTHER): Payer: Self-pay | Admitting: Family Medicine

## 2024-02-12 DIAGNOSIS — E890 Postprocedural hypothyroidism: Secondary | ICD-10-CM | POA: Diagnosis not present

## 2024-02-12 DIAGNOSIS — R2242 Localized swelling, mass and lump, left lower limb: Secondary | ICD-10-CM

## 2024-02-12 DIAGNOSIS — R030 Elevated blood-pressure reading, without diagnosis of hypertension: Secondary | ICD-10-CM | POA: Diagnosis not present

## 2024-02-12 DIAGNOSIS — Z6832 Body mass index (BMI) 32.0-32.9, adult: Secondary | ICD-10-CM | POA: Diagnosis not present

## 2024-02-12 DIAGNOSIS — E785 Hyperlipidemia, unspecified: Secondary | ICD-10-CM | POA: Diagnosis not present

## 2024-02-13 ENCOUNTER — Ambulatory Visit (HOSPITAL_BASED_OUTPATIENT_CLINIC_OR_DEPARTMENT_OTHER)
Admission: RE | Admit: 2024-02-13 | Discharge: 2024-02-13 | Disposition: A | Discharge status: Home / Self Care | Source: Ambulatory Visit | Attending: Family Medicine | Admitting: Family Medicine

## 2024-02-13 DIAGNOSIS — R2242 Localized swelling, mass and lump, left lower limb: Secondary | ICD-10-CM | POA: Diagnosis not present

## 2024-02-13 DIAGNOSIS — Z0389 Encounter for observation for other suspected diseases and conditions ruled out: Secondary | ICD-10-CM | POA: Diagnosis not present

## 2024-02-19 ENCOUNTER — Ambulatory Visit: Payer: Medicare Other | Admitting: Family Medicine

## 2024-04-07 DIAGNOSIS — K080 Exfoliation of teeth due to systemic causes: Secondary | ICD-10-CM | POA: Diagnosis not present

## 2024-06-03 DIAGNOSIS — E890 Postprocedural hypothyroidism: Secondary | ICD-10-CM | POA: Diagnosis not present

## 2024-06-03 DIAGNOSIS — E785 Hyperlipidemia, unspecified: Secondary | ICD-10-CM | POA: Diagnosis not present

## 2024-06-10 DIAGNOSIS — Z1331 Encounter for screening for depression: Secondary | ICD-10-CM | POA: Diagnosis not present

## 2024-06-10 DIAGNOSIS — Z6833 Body mass index (BMI) 33.0-33.9, adult: Secondary | ICD-10-CM | POA: Diagnosis not present

## 2024-06-10 DIAGNOSIS — E785 Hyperlipidemia, unspecified: Secondary | ICD-10-CM | POA: Diagnosis not present

## 2024-06-10 DIAGNOSIS — E890 Postprocedural hypothyroidism: Secondary | ICD-10-CM | POA: Diagnosis not present

## 2024-08-18 ENCOUNTER — Other Ambulatory Visit (HOSPITAL_BASED_OUTPATIENT_CLINIC_OR_DEPARTMENT_OTHER): Payer: Self-pay | Admitting: Family Medicine

## 2024-08-18 DIAGNOSIS — M8580 Other specified disorders of bone density and structure, unspecified site: Secondary | ICD-10-CM

## 2024-08-18 DIAGNOSIS — Z1389 Encounter for screening for other disorder: Secondary | ICD-10-CM | POA: Diagnosis not present

## 2024-08-18 DIAGNOSIS — Z6834 Body mass index (BMI) 34.0-34.9, adult: Secondary | ICD-10-CM | POA: Diagnosis not present

## 2024-08-18 DIAGNOSIS — Z136 Encounter for screening for cardiovascular disorders: Secondary | ICD-10-CM | POA: Diagnosis not present

## 2024-08-18 DIAGNOSIS — Z2821 Immunization not carried out because of patient refusal: Secondary | ICD-10-CM | POA: Diagnosis not present

## 2024-08-18 DIAGNOSIS — Z1339 Encounter for screening examination for other mental health and behavioral disorders: Secondary | ICD-10-CM | POA: Diagnosis not present

## 2024-08-18 DIAGNOSIS — Z1331 Encounter for screening for depression: Secondary | ICD-10-CM | POA: Diagnosis not present

## 2024-08-18 DIAGNOSIS — Z0000 Encounter for general adult medical examination without abnormal findings: Secondary | ICD-10-CM | POA: Diagnosis not present

## 2024-08-18 DIAGNOSIS — Z139 Encounter for screening, unspecified: Secondary | ICD-10-CM | POA: Diagnosis not present

## 2024-09-09 DIAGNOSIS — E890 Postprocedural hypothyroidism: Secondary | ICD-10-CM | POA: Diagnosis not present

## 2024-09-09 DIAGNOSIS — E785 Hyperlipidemia, unspecified: Secondary | ICD-10-CM | POA: Diagnosis not present

## 2024-09-16 DIAGNOSIS — Z2821 Immunization not carried out because of patient refusal: Secondary | ICD-10-CM | POA: Diagnosis not present

## 2024-09-16 DIAGNOSIS — M8580 Other specified disorders of bone density and structure, unspecified site: Secondary | ICD-10-CM | POA: Diagnosis not present

## 2024-09-16 DIAGNOSIS — Z6834 Body mass index (BMI) 34.0-34.9, adult: Secondary | ICD-10-CM | POA: Diagnosis not present

## 2024-09-16 DIAGNOSIS — E785 Hyperlipidemia, unspecified: Secondary | ICD-10-CM | POA: Diagnosis not present

## 2024-09-16 DIAGNOSIS — E890 Postprocedural hypothyroidism: Secondary | ICD-10-CM | POA: Diagnosis not present

## 2024-09-28 ENCOUNTER — Ambulatory Visit (INDEPENDENT_AMBULATORY_CARE_PROVIDER_SITE_OTHER)
Admission: RE | Admit: 2024-09-28 | Discharge: 2024-09-28 | Disposition: A | Source: Ambulatory Visit | Attending: Family Medicine | Admitting: Radiology

## 2024-09-28 DIAGNOSIS — M8580 Other specified disorders of bone density and structure, unspecified site: Secondary | ICD-10-CM | POA: Diagnosis not present

## 2024-09-28 DIAGNOSIS — M85851 Other specified disorders of bone density and structure, right thigh: Secondary | ICD-10-CM | POA: Diagnosis not present

## 2024-09-28 DIAGNOSIS — Z780 Asymptomatic menopausal state: Secondary | ICD-10-CM

## 2024-09-28 DIAGNOSIS — M85852 Other specified disorders of bone density and structure, left thigh: Secondary | ICD-10-CM | POA: Diagnosis not present

## 2024-11-20 ENCOUNTER — Other Ambulatory Visit: Payer: Self-pay | Admitting: Family Medicine

## 2024-11-20 DIAGNOSIS — Z1231 Encounter for screening mammogram for malignant neoplasm of breast: Secondary | ICD-10-CM

## 2024-11-25 ENCOUNTER — Inpatient Hospital Stay: Admission: RE | Admit: 2024-11-25
